# Patient Record
Sex: Female | Born: 1955 | Race: Black or African American | Hispanic: No | Marital: Married | Smoking: Never smoker
Health system: Southern US, Community
[De-identification: ages and names within clinical notes are randomized; demographics above are authoritative.]

## PROBLEM LIST (undated history)

## (undated) DIAGNOSIS — I10 Essential (primary) hypertension: Secondary | ICD-10-CM

## (undated) DIAGNOSIS — E119 Type 2 diabetes mellitus without complications: Secondary | ICD-10-CM

## (undated) HISTORY — PX: BARIATRIC SURGERY: SHX1103

---

## 1999-08-14 ENCOUNTER — Emergency Department (HOSPITAL_COMMUNITY): Admission: EM | Admit: 1999-08-14 | Discharge: 1999-08-14 | Payer: Self-pay | Admitting: Emergency Medicine

## 1999-09-08 ENCOUNTER — Encounter: Payer: Self-pay | Admitting: Obstetrics and Gynecology

## 1999-09-08 ENCOUNTER — Encounter: Admission: RE | Admit: 1999-09-08 | Discharge: 1999-09-08 | Payer: Self-pay | Admitting: Obstetrics and Gynecology

## 2001-06-14 ENCOUNTER — Encounter: Payer: Self-pay | Admitting: Emergency Medicine

## 2001-06-14 ENCOUNTER — Emergency Department (HOSPITAL_COMMUNITY): Admission: EM | Admit: 2001-06-14 | Discharge: 2001-06-14 | Payer: Self-pay | Admitting: Emergency Medicine

## 2014-11-01 HISTORY — PX: BREAST BIOPSY: SHX20

## 2015-04-07 ENCOUNTER — Other Ambulatory Visit: Payer: Self-pay

## 2015-04-07 DIAGNOSIS — Z1231 Encounter for screening mammogram for malignant neoplasm of breast: Secondary | ICD-10-CM

## 2015-04-14 ENCOUNTER — Ambulatory Visit: Payer: Self-pay

## 2015-05-15 ENCOUNTER — Ambulatory Visit
Admission: RE | Admit: 2015-05-15 | Discharge: 2015-05-15 | Disposition: A | Discharge status: Home / Self Care | Payer: 59 | Source: Ambulatory Visit

## 2015-05-15 DIAGNOSIS — Z1231 Encounter for screening mammogram for malignant neoplasm of breast: Secondary | ICD-10-CM

## 2015-05-20 ENCOUNTER — Other Ambulatory Visit: Payer: Self-pay | Admitting: Internal Medicine

## 2015-05-20 DIAGNOSIS — R928 Other abnormal and inconclusive findings on diagnostic imaging of breast: Secondary | ICD-10-CM

## 2015-07-04 ENCOUNTER — Other Ambulatory Visit: Payer: Self-pay | Admitting: Internal Medicine

## 2015-07-04 ENCOUNTER — Ambulatory Visit
Admission: RE | Admit: 2015-07-04 | Discharge: 2015-07-04 | Disposition: A | Discharge status: Home / Self Care | Payer: 59 | Source: Ambulatory Visit | Attending: Internal Medicine | Admitting: Internal Medicine

## 2015-07-04 DIAGNOSIS — R928 Other abnormal and inconclusive findings on diagnostic imaging of breast: Secondary | ICD-10-CM

## 2015-07-09 ENCOUNTER — Ambulatory Visit
Admission: RE | Admit: 2015-07-09 | Discharge: 2015-07-09 | Disposition: A | Discharge status: Home / Self Care | Payer: 59 | Source: Ambulatory Visit | Attending: Internal Medicine | Admitting: Internal Medicine

## 2015-07-09 ENCOUNTER — Other Ambulatory Visit: Payer: Self-pay | Admitting: Internal Medicine

## 2015-07-09 DIAGNOSIS — R928 Other abnormal and inconclusive findings on diagnostic imaging of breast: Secondary | ICD-10-CM

## 2016-02-12 ENCOUNTER — Other Ambulatory Visit: Payer: Self-pay

## 2016-02-12 ENCOUNTER — Other Ambulatory Visit: Payer: Self-pay | Admitting: Internal Medicine

## 2016-02-12 DIAGNOSIS — Z1231 Encounter for screening mammogram for malignant neoplasm of breast: Secondary | ICD-10-CM

## 2016-02-18 DIAGNOSIS — E669 Obesity, unspecified: Secondary | ICD-10-CM | POA: Diagnosis not present

## 2016-03-05 DIAGNOSIS — L509 Urticaria, unspecified: Secondary | ICD-10-CM | POA: Diagnosis not present

## 2016-03-05 DIAGNOSIS — R7301 Impaired fasting glucose: Secondary | ICD-10-CM | POA: Diagnosis not present

## 2016-03-05 DIAGNOSIS — I10 Essential (primary) hypertension: Secondary | ICD-10-CM | POA: Diagnosis not present

## 2016-04-05 DIAGNOSIS — I10 Essential (primary) hypertension: Secondary | ICD-10-CM | POA: Diagnosis not present

## 2016-04-05 DIAGNOSIS — R7301 Impaired fasting glucose: Secondary | ICD-10-CM | POA: Diagnosis not present

## 2016-04-05 DIAGNOSIS — L509 Urticaria, unspecified: Secondary | ICD-10-CM | POA: Diagnosis not present

## 2016-05-17 ENCOUNTER — Inpatient Hospital Stay: Admission: RE | Admit: 2016-05-17 | Payer: Self-pay | Source: Ambulatory Visit

## 2016-05-31 ENCOUNTER — Ambulatory Visit (HOSPITAL_COMMUNITY)
Admission: EM | Admit: 2016-05-31 | Discharge: 2016-05-31 | Disposition: A | Discharge status: Home / Self Care | Payer: BLUE CROSS/BLUE SHIELD | Attending: Family Medicine | Admitting: Family Medicine

## 2016-05-31 ENCOUNTER — Encounter (HOSPITAL_COMMUNITY): Payer: Self-pay | Admitting: Emergency Medicine

## 2016-05-31 DIAGNOSIS — S76219A Strain of adductor muscle, fascia and tendon of unspecified thigh, initial encounter: Secondary | ICD-10-CM

## 2016-05-31 DIAGNOSIS — S39011A Strain of muscle, fascia and tendon of abdomen, initial encounter: Secondary | ICD-10-CM

## 2016-05-31 DIAGNOSIS — S39012A Strain of muscle, fascia and tendon of lower back, initial encounter: Secondary | ICD-10-CM

## 2016-05-31 HISTORY — DX: Essential (primary) hypertension: I10

## 2016-05-31 HISTORY — DX: Type 2 diabetes mellitus without complications: E11.9

## 2016-05-31 MED ORDER — METHYLPREDNISOLONE 4 MG PO TBPK: ORAL_TABLET | ORAL | 0 refills | Status: DC

## 2016-05-31 MED ORDER — CYCLOBENZAPRINE HCL 10 MG PO TABS: 10.0000 mg | ORAL_TABLET | Freq: Two times a day (BID) | ORAL | 0 refills | Status: DC | PRN

## 2016-05-31 NOTE — ED Triage Notes (Signed)
Patient reports one week ago, bending over and using her knees as well to pick up a "pan of concrete".  Felt pain immediately .  Since then pain in lower back, left hip and low pelvis.  No urinary or bowel complaints

## 2016-05-31 NOTE — ED Provider Notes (Signed)
CSN: [id number]     Arrival date & time 05/31/16  0165 History   None    Chief Complaint  Patient presents with  . Back Pain   (Consider location/radiation/quality/duration/timing/severity/associated sxs/prior Treatment) Patient states 2 weeks ago she was mixing cement and pulled a muscle in her back and she is hurting on the left side and it radiates down her left butock.   The history is provided by the patient.    No past medical history on file. No past surgical history on file. No family history on file. Social History  Substance Use Topics  . Smoking status: Not on file  . Smokeless tobacco: Not on file  . Alcohol use Not on file   OB History    No data available     Review of Systems  Constitutional: Negative.   HENT: Negative.   Eyes: Negative.   Respiratory: Negative.   Cardiovascular: Negative.   Gastrointestinal: Negative.   Endocrine: Negative.   Genitourinary: Negative.   Musculoskeletal: Positive for back pain.  Skin: Negative.   Allergic/Immunologic: Negative.   Neurological: Negative.   Hematological: Negative.   Psychiatric/Behavioral: Negative.     Allergies  Review of patient's allergies indicates not on file.  Home Medications   Prior to Admission medications   Not on File   Meds Ordered and Administered this Visit  Medications - No data to display  There were no vitals taken for this visit. No data found.   Physical Exam  Constitutional: She appears well-developed and well-nourished.  HENT:  Head: Normocephalic and atraumatic.  Eyes: Pupils are equal, round, and reactive to light.  Cardiovascular: Normal rate, regular rhythm and normal heart sounds.   Pulmonary/Chest: Effort normal and breath sounds normal.  Musculoskeletal: She exhibits tenderness.  Left lumbar paraspinous muscle w/o tenderness and decreased Rom lumbar spine.    Urgent Care Course   Clinical Course    Procedures (including critical care time)  Labs  Review Labs Reviewed - No data to display  Imaging Review No results found.   Visual Acuity Review  Right Eye Distance:   Left Eye Distance:   Bilateral Distance:    Right Eye Near:   Left Eye Near:    Bilateral Near:         MDM  Left lumbar muscle strain - Medrol dose pack as directed, cyclobenzaprine  10mg  one po bid prn pain #20.  Apply heating pad to LS spine.      Deatra Canter, FNP 05/31/16 1102

## 2016-06-14 DIAGNOSIS — R7301 Impaired fasting glucose: Secondary | ICD-10-CM | POA: Diagnosis not present

## 2016-06-14 DIAGNOSIS — M545 Low back pain: Secondary | ICD-10-CM | POA: Diagnosis not present

## 2016-06-17 DIAGNOSIS — N62 Hypertrophy of breast: Secondary | ICD-10-CM | POA: Diagnosis not present

## 2016-06-17 DIAGNOSIS — Z9884 Bariatric surgery status: Secondary | ICD-10-CM | POA: Diagnosis not present

## 2016-06-23 ENCOUNTER — Ambulatory Visit: Payer: Self-pay

## 2016-07-01 ENCOUNTER — Ambulatory Visit: Payer: Self-pay

## 2016-07-15 ENCOUNTER — Ambulatory Visit
Admission: RE | Admit: 2016-07-15 | Discharge: 2016-07-15 | Disposition: A | Discharge status: Home / Self Care | Payer: BLUE CROSS/BLUE SHIELD | Source: Ambulatory Visit

## 2016-07-15 DIAGNOSIS — Z1231 Encounter for screening mammogram for malignant neoplasm of breast: Secondary | ICD-10-CM | POA: Diagnosis not present

## 2016-07-16 DIAGNOSIS — Z9884 Bariatric surgery status: Secondary | ICD-10-CM | POA: Diagnosis not present

## 2016-07-16 DIAGNOSIS — R1013 Epigastric pain: Secondary | ICD-10-CM | POA: Diagnosis not present

## 2016-07-16 DIAGNOSIS — M109 Gout, unspecified: Secondary | ICD-10-CM | POA: Diagnosis not present

## 2016-07-16 DIAGNOSIS — Z92241 Personal history of systemic steroid therapy: Secondary | ICD-10-CM | POA: Diagnosis not present

## 2016-07-16 DIAGNOSIS — K912 Postsurgical malabsorption, not elsewhere classified: Secondary | ICD-10-CM | POA: Diagnosis not present

## 2016-08-04 DIAGNOSIS — I10 Essential (primary) hypertension: Secondary | ICD-10-CM | POA: Diagnosis not present

## 2016-08-04 DIAGNOSIS — Z9884 Bariatric surgery status: Secondary | ICD-10-CM | POA: Diagnosis not present

## 2016-09-08 DIAGNOSIS — Z1389 Encounter for screening for other disorder: Secondary | ICD-10-CM | POA: Diagnosis not present

## 2016-09-08 DIAGNOSIS — Z01419 Encounter for gynecological examination (general) (routine) without abnormal findings: Secondary | ICD-10-CM | POA: Diagnosis not present

## 2016-09-08 DIAGNOSIS — Z130 Encounter for screening for diseases of the blood and blood-forming organs and certain disorders involving the immune mechanism: Secondary | ICD-10-CM | POA: Diagnosis not present

## 2016-09-08 DIAGNOSIS — R1900 Intra-abdominal and pelvic swelling, mass and lump, unspecified site: Secondary | ICD-10-CM | POA: Diagnosis not present

## 2016-09-14 DIAGNOSIS — R1904 Left lower quadrant abdominal swelling, mass and lump: Secondary | ICD-10-CM | POA: Diagnosis not present

## 2016-09-27 DIAGNOSIS — I10 Essential (primary) hypertension: Secondary | ICD-10-CM | POA: Diagnosis not present

## 2016-09-27 DIAGNOSIS — R7301 Impaired fasting glucose: Secondary | ICD-10-CM | POA: Diagnosis not present

## 2016-09-27 DIAGNOSIS — B355 Tinea imbricata: Secondary | ICD-10-CM | POA: Diagnosis not present

## 2016-09-27 DIAGNOSIS — L509 Urticaria, unspecified: Secondary | ICD-10-CM | POA: Diagnosis not present

## 2016-12-27 DIAGNOSIS — M26609 Unspecified temporomandibular joint disorder, unspecified side: Secondary | ICD-10-CM | POA: Diagnosis not present

## 2016-12-27 DIAGNOSIS — R7301 Impaired fasting glucose: Secondary | ICD-10-CM | POA: Diagnosis not present

## 2016-12-27 DIAGNOSIS — I10 Essential (primary) hypertension: Secondary | ICD-10-CM | POA: Diagnosis not present

## 2017-02-10 DIAGNOSIS — Z903 Acquired absence of stomach [part of]: Secondary | ICD-10-CM | POA: Diagnosis not present

## 2017-04-01 DIAGNOSIS — H1045 Other chronic allergic conjunctivitis: Secondary | ICD-10-CM | POA: Diagnosis not present

## 2017-04-11 DIAGNOSIS — M542 Cervicalgia: Secondary | ICD-10-CM | POA: Diagnosis not present

## 2017-04-11 DIAGNOSIS — L304 Erythema intertrigo: Secondary | ICD-10-CM | POA: Diagnosis not present

## 2017-04-11 DIAGNOSIS — N62 Hypertrophy of breast: Secondary | ICD-10-CM | POA: Diagnosis not present

## 2017-04-11 DIAGNOSIS — M549 Dorsalgia, unspecified: Secondary | ICD-10-CM | POA: Diagnosis not present

## 2017-05-16 DIAGNOSIS — R7301 Impaired fasting glucose: Secondary | ICD-10-CM | POA: Diagnosis not present

## 2017-05-16 DIAGNOSIS — N62 Hypertrophy of breast: Secondary | ICD-10-CM | POA: Diagnosis not present

## 2017-05-16 DIAGNOSIS — Z13220 Encounter for screening for lipoid disorders: Secondary | ICD-10-CM | POA: Diagnosis not present

## 2017-05-16 DIAGNOSIS — I10 Essential (primary) hypertension: Secondary | ICD-10-CM | POA: Diagnosis not present

## 2017-08-04 ENCOUNTER — Other Ambulatory Visit: Payer: Self-pay | Admitting: Internal Medicine

## 2017-08-04 DIAGNOSIS — Z1231 Encounter for screening mammogram for malignant neoplasm of breast: Secondary | ICD-10-CM

## 2017-08-08 DIAGNOSIS — Z903 Acquired absence of stomach [part of]: Secondary | ICD-10-CM | POA: Diagnosis not present

## 2017-08-08 DIAGNOSIS — K912 Postsurgical malabsorption, not elsewhere classified: Secondary | ICD-10-CM | POA: Diagnosis not present

## 2017-08-24 ENCOUNTER — Ambulatory Visit
Admission: RE | Admit: 2017-08-24 | Discharge: 2017-08-24 | Disposition: A | Discharge status: Home / Self Care | Payer: BLUE CROSS/BLUE SHIELD | Source: Ambulatory Visit | Attending: Internal Medicine | Admitting: Internal Medicine

## 2017-08-24 DIAGNOSIS — Z1231 Encounter for screening mammogram for malignant neoplasm of breast: Secondary | ICD-10-CM

## 2017-09-27 DIAGNOSIS — I10 Essential (primary) hypertension: Secondary | ICD-10-CM | POA: Diagnosis not present

## 2017-09-27 DIAGNOSIS — Z903 Acquired absence of stomach [part of]: Secondary | ICD-10-CM | POA: Diagnosis not present

## 2017-09-27 DIAGNOSIS — K912 Postsurgical malabsorption, not elsewhere classified: Secondary | ICD-10-CM | POA: Diagnosis not present

## 2017-10-05 DIAGNOSIS — Z130 Encounter for screening for diseases of the blood and blood-forming organs and certain disorders involving the immune mechanism: Secondary | ICD-10-CM | POA: Diagnosis not present

## 2017-10-05 DIAGNOSIS — Z1389 Encounter for screening for other disorder: Secondary | ICD-10-CM | POA: Diagnosis not present

## 2017-10-05 DIAGNOSIS — Z6824 Body mass index (BMI) 24.0-24.9, adult: Secondary | ICD-10-CM | POA: Diagnosis not present

## 2017-10-05 DIAGNOSIS — Z01419 Encounter for gynecological examination (general) (routine) without abnormal findings: Secondary | ICD-10-CM | POA: Diagnosis not present

## 2018-07-14 ENCOUNTER — Other Ambulatory Visit: Payer: Self-pay | Admitting: Internal Medicine

## 2018-07-14 DIAGNOSIS — Z1231 Encounter for screening mammogram for malignant neoplasm of breast: Secondary | ICD-10-CM

## 2018-08-22 ENCOUNTER — Emergency Department (HOSPITAL_COMMUNITY): Payer: BLUE CROSS/BLUE SHIELD

## 2018-08-22 ENCOUNTER — Other Ambulatory Visit: Payer: Self-pay

## 2018-08-22 ENCOUNTER — Encounter (HOSPITAL_COMMUNITY): Payer: Self-pay | Admitting: Emergency Medicine

## 2018-08-22 ENCOUNTER — Emergency Department (HOSPITAL_COMMUNITY)
Admission: EM | Admit: 2018-08-22 | Discharge: 2018-08-22 | Disposition: A | Discharge status: Home / Self Care | Payer: BLUE CROSS/BLUE SHIELD | Attending: Emergency Medicine | Admitting: Emergency Medicine

## 2018-08-22 DIAGNOSIS — Y998 Other external cause status: Secondary | ICD-10-CM | POA: Insufficient documentation

## 2018-08-22 DIAGNOSIS — Y92410 Unspecified street and highway as the place of occurrence of the external cause: Secondary | ICD-10-CM | POA: Insufficient documentation

## 2018-08-22 DIAGNOSIS — Y9389 Activity, other specified: Secondary | ICD-10-CM | POA: Insufficient documentation

## 2018-08-22 DIAGNOSIS — R079 Chest pain, unspecified: Secondary | ICD-10-CM | POA: Diagnosis not present

## 2018-08-22 DIAGNOSIS — I10 Essential (primary) hypertension: Secondary | ICD-10-CM | POA: Insufficient documentation

## 2018-08-22 DIAGNOSIS — R0789 Other chest pain: Secondary | ICD-10-CM | POA: Diagnosis not present

## 2018-08-22 DIAGNOSIS — E119 Type 2 diabetes mellitus without complications: Secondary | ICD-10-CM | POA: Insufficient documentation

## 2018-08-22 LAB — I-STAT CHEM 8, ED
BUN: 11 mg/dL (ref 8–23)
CALCIUM ION: 1.21 mmol/L (ref 1.15–1.40)
Chloride: 102 mmol/L (ref 98–111)
Creatinine, Ser: 0.8 mg/dL (ref 0.44–1.00)
GLUCOSE: 145 mg/dL — AB (ref 70–99)
HCT: 35 % — ABNORMAL LOW (ref 36.0–46.0)
Hemoglobin: 11.9 g/dL — ABNORMAL LOW (ref 12.0–15.0)
Potassium: 3.6 mmol/L (ref 3.5–5.1)
SODIUM: 137 mmol/L (ref 135–145)
TCO2: 29 mmol/L (ref 22–32)

## 2018-08-22 MED ORDER — IBUPROFEN 600 MG PO TABS: 600.0000 mg | ORAL_TABLET | Freq: Three times a day (TID) | ORAL | 0 refills | Status: DC | PRN

## 2018-08-22 MED ORDER — IBUPROFEN 400 MG PO TABS
600.0000 mg | ORAL_TABLET | Freq: Once | ORAL | Status: AC
  Administered 2018-08-22: 600 mg via ORAL
  Filled 2018-08-22: qty 1

## 2018-08-22 MED ORDER — ACETAMINOPHEN 500 MG PO TABS
1000.0000 mg | ORAL_TABLET | Freq: Once | ORAL | Status: AC
Start: 2018-08-22 — End: 2018-08-22
  Administered 2018-08-22: 1000 mg via ORAL
  Filled 2018-08-22: qty 2

## 2018-08-22 MED ORDER — CYCLOBENZAPRINE HCL 10 MG PO TABS
5.0000 mg | ORAL_TABLET | Freq: Once | ORAL | Status: AC
  Administered 2018-08-22: 5 mg via ORAL
  Filled 2018-08-22: qty 1

## 2018-08-22 MED ORDER — CYCLOBENZAPRINE HCL 10 MG PO TABS: 10.0000 mg | ORAL_TABLET | Freq: Three times a day (TID) | ORAL | 0 refills | Status: DC | PRN

## 2018-08-22 NOTE — ED Notes (Signed)
Patient verbalizes understanding of discharge instructions. Opportunity for questioning and answers were provided. Ambulatory at discharge in NAD.  

## 2018-08-22 NOTE — ED Notes (Signed)
ED Provider at bedside. 
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
## 2018-08-22 NOTE — ED Notes (Signed)
Got patient on the monitor patient is resting with call bell in reach and family at bedside °

## 2018-08-25 ENCOUNTER — Ambulatory Visit: Payer: BLUE CROSS/BLUE SHIELD

## 2018-08-25 DIAGNOSIS — R7303 Prediabetes: Secondary | ICD-10-CM | POA: Diagnosis not present

## 2018-08-25 DIAGNOSIS — Z0000 Encounter for general adult medical examination without abnormal findings: Secondary | ICD-10-CM | POA: Diagnosis not present

## 2018-08-25 DIAGNOSIS — M542 Cervicalgia: Secondary | ICD-10-CM | POA: Diagnosis not present

## 2018-08-25 DIAGNOSIS — M545 Low back pain: Secondary | ICD-10-CM | POA: Diagnosis not present

## 2018-08-25 DIAGNOSIS — Z13220 Encounter for screening for lipoid disorders: Secondary | ICD-10-CM | POA: Diagnosis not present

## 2018-08-25 DIAGNOSIS — L259 Unspecified contact dermatitis, unspecified cause: Secondary | ICD-10-CM | POA: Diagnosis not present

## 2018-08-25 DIAGNOSIS — R071 Chest pain on breathing: Secondary | ICD-10-CM | POA: Diagnosis not present

## 2018-08-25 DIAGNOSIS — I10 Essential (primary) hypertension: Secondary | ICD-10-CM | POA: Diagnosis not present

## 2018-10-06 ENCOUNTER — Ambulatory Visit
Admission: RE | Admit: 2018-10-06 | Discharge: 2018-10-06 | Disposition: A | Discharge status: Home / Self Care | Payer: BLUE CROSS/BLUE SHIELD | Source: Ambulatory Visit | Attending: Internal Medicine | Admitting: Internal Medicine

## 2018-10-06 DIAGNOSIS — Z1231 Encounter for screening mammogram for malignant neoplasm of breast: Secondary | ICD-10-CM | POA: Diagnosis not present

## 2018-10-09 DIAGNOSIS — N762 Acute vulvitis: Secondary | ICD-10-CM | POA: Diagnosis not present

## 2018-10-09 DIAGNOSIS — Z1389 Encounter for screening for other disorder: Secondary | ICD-10-CM | POA: Diagnosis not present

## 2018-10-09 DIAGNOSIS — Z130 Encounter for screening for diseases of the blood and blood-forming organs and certain disorders involving the immune mechanism: Secondary | ICD-10-CM | POA: Diagnosis not present

## 2018-10-09 DIAGNOSIS — Z01419 Encounter for gynecological examination (general) (routine) without abnormal findings: Secondary | ICD-10-CM | POA: Diagnosis not present

## 2018-12-10 ENCOUNTER — Encounter (HOSPITAL_COMMUNITY): Payer: Self-pay

## 2018-12-10 ENCOUNTER — Other Ambulatory Visit: Payer: Self-pay

## 2018-12-10 ENCOUNTER — Inpatient Hospital Stay (HOSPITAL_COMMUNITY)
Admission: EM | Admit: 2018-12-10 | Discharge: 2018-12-13 | DRG: 392 | Disposition: A | Discharge status: Home / Self Care | Payer: BLUE CROSS/BLUE SHIELD | Attending: Surgery | Admitting: Surgery

## 2018-12-10 ENCOUNTER — Emergency Department (HOSPITAL_COMMUNITY): Payer: BLUE CROSS/BLUE SHIELD

## 2018-12-10 DIAGNOSIS — R1111 Vomiting without nausea: Secondary | ICD-10-CM | POA: Diagnosis not present

## 2018-12-10 DIAGNOSIS — Z8679 Personal history of other diseases of the circulatory system: Secondary | ICD-10-CM

## 2018-12-10 DIAGNOSIS — K5909 Other constipation: Secondary | ICD-10-CM | POA: Diagnosis not present

## 2018-12-10 DIAGNOSIS — Z9884 Bariatric surgery status: Secondary | ICD-10-CM | POA: Diagnosis not present

## 2018-12-10 DIAGNOSIS — Z8639 Personal history of other endocrine, nutritional and metabolic disease: Secondary | ICD-10-CM | POA: Diagnosis not present

## 2018-12-10 DIAGNOSIS — K56609 Unspecified intestinal obstruction, unspecified as to partial versus complete obstruction: Secondary | ICD-10-CM | POA: Diagnosis not present

## 2018-12-10 DIAGNOSIS — R1030 Lower abdominal pain, unspecified: Secondary | ICD-10-CM | POA: Diagnosis not present

## 2018-12-10 DIAGNOSIS — R1084 Generalized abdominal pain: Secondary | ICD-10-CM | POA: Diagnosis not present

## 2018-12-10 DIAGNOSIS — R112 Nausea with vomiting, unspecified: Secondary | ICD-10-CM | POA: Diagnosis not present

## 2018-12-10 DIAGNOSIS — K56600 Partial intestinal obstruction, unspecified as to cause: Secondary | ICD-10-CM | POA: Diagnosis not present

## 2018-12-10 LAB — URINALYSIS, ROUTINE W REFLEX MICROSCOPIC
Bilirubin Urine: NEGATIVE
GLUCOSE, UA: NEGATIVE mg/dL
Hgb urine dipstick: NEGATIVE
KETONES UR: 20 mg/dL — AB
Leukocytes, UA: NEGATIVE
NITRITE: NEGATIVE
PH: 8 (ref 5.0–8.0)
Protein, ur: NEGATIVE mg/dL
Specific Gravity, Urine: 1.04 — ABNORMAL HIGH (ref 1.005–1.030)

## 2018-12-10 LAB — COMPREHENSIVE METABOLIC PANEL
ALT: 14 U/L (ref 0–44)
AST: 22 U/L (ref 15–41)
Albumin: 4.3 g/dL (ref 3.5–5.0)
Alkaline Phosphatase: 53 U/L (ref 38–126)
Anion gap: 8 (ref 5–15)
BUN: 16 mg/dL (ref 8–23)
CHLORIDE: 105 mmol/L (ref 98–111)
CO2: 27 mmol/L (ref 22–32)
CREATININE: 0.68 mg/dL (ref 0.44–1.00)
Calcium: 9.4 mg/dL (ref 8.9–10.3)
GFR calc Af Amer: >60 mL/min (ref >60)
Glucose, Bld: 128 mg/dL — ABNORMAL HIGH (ref 70–99)
Potassium: 3.7 mmol/L (ref 3.5–5.1)
Sodium: 140 mmol/L (ref 135–145)
Total Bilirubin: 0.8 mg/dL (ref 0.3–1.2)
Total Protein: 7.1 g/dL (ref 6.5–8.1)

## 2018-12-10 LAB — CBC
HEMATOCRIT: 37 % (ref 36.0–46.0)
Hemoglobin: 12.7 g/dL (ref 12.0–15.0)
MCH: 27.9 pg (ref 26.0–34.0)
MCHC: 34.3 g/dL (ref 30.0–36.0)
MCV: 81.1 fL (ref 80.0–100.0)
Platelets: 320 10*3/uL (ref 150–400)
RBC: 4.56 MIL/uL (ref 3.87–5.11)
RDW: 12.4 % (ref 11.5–15.5)
WBC: 9.5 10*3/uL (ref 4.0–10.5)
nRBC: 0 % (ref 0.0–0.2)

## 2018-12-10 LAB — LIPASE, BLOOD: LIPASE: 25 U/L (ref 11–51)

## 2018-12-10 MED ORDER — ONDANSETRON HCL 4 MG/2ML IJ SOLN
4.0000 mg | Freq: Once | INTRAMUSCULAR | Status: AC
  Administered 2018-12-10: 4 mg via INTRAVENOUS
  Filled 2018-12-10: qty 2

## 2018-12-10 MED ORDER — MORPHINE SULFATE (PF) 4 MG/ML IV SOLN
4.0000 mg | Freq: Once | INTRAVENOUS | Status: AC
  Administered 2018-12-10: 4 mg via INTRAVENOUS
  Filled 2018-12-10: qty 1

## 2018-12-10 MED ORDER — MORPHINE SULFATE (PF) 2 MG/ML IV SOLN
1.0000 mg | INTRAVENOUS | Status: DC | PRN
  Administered 2018-12-10 (×2): 4 mg via INTRAVENOUS
  Administered 2018-12-11 (×2): 2 mg via INTRAVENOUS
  Administered 2018-12-11: 4 mg via INTRAVENOUS
  Administered 2018-12-12 (×4): 2 mg via INTRAVENOUS
  Filled 2018-12-10 (×2): qty 1
  Filled 2018-12-10: qty 2
  Filled 2018-12-10: qty 1
  Filled 2018-12-10 (×2): qty 2
  Filled 2018-12-10 (×3): qty 1

## 2018-12-10 MED ORDER — IOPAMIDOL (ISOVUE-300) INJECTION 61%
100.0000 mL | Freq: Once | INTRAVENOUS | Status: AC | PRN
  Administered 2018-12-10: 100 mL via INTRAVENOUS

## 2018-12-10 MED ORDER — LACTATED RINGERS IV BOLUS
1000.0000 mL | Freq: Once | INTRAVENOUS | Status: AC
  Administered 2018-12-10: 1000 mL via INTRAVENOUS

## 2018-12-10 MED ORDER — SODIUM CHLORIDE 0.9% FLUSH
3.0000 mL | Freq: Once | INTRAVENOUS | Status: AC
  Administered 2018-12-10: 3 mL via INTRAVENOUS

## 2018-12-10 MED ORDER — ONDANSETRON HCL 4 MG/2ML IJ SOLN
4.0000 mg | Freq: Four times a day (QID) | INTRAMUSCULAR | Status: DC | PRN
  Administered 2018-12-12 (×2): 4 mg via INTRAVENOUS
  Filled 2018-12-10 (×2): qty 2

## 2018-12-10 MED ORDER — SODIUM CHLORIDE (PF) 0.9 % IJ SOLN
INTRAMUSCULAR | Status: AC
  Filled 2018-12-10: qty 50

## 2018-12-10 MED ORDER — ACETAMINOPHEN 650 MG RE SUPP: 650.0000 mg | Freq: Four times a day (QID) | RECTAL | Status: DC | PRN

## 2018-12-10 MED ORDER — ONDANSETRON 4 MG PO TBDP: 4.0000 mg | ORAL_TABLET | Freq: Four times a day (QID) | ORAL | Status: DC | PRN

## 2018-12-10 MED ORDER — IOPAMIDOL (ISOVUE-300) INJECTION 61%
INTRAVENOUS | Status: AC
  Filled 2018-12-10: qty 100

## 2018-12-10 MED ORDER — DIPHENHYDRAMINE HCL 50 MG/ML IJ SOLN
25.0000 mg | Freq: Four times a day (QID) | INTRAMUSCULAR | Status: DC | PRN
  Administered 2018-12-10 – 2018-12-12 (×5): 25 mg via INTRAVENOUS
  Filled 2018-12-10 (×5): qty 1

## 2018-12-10 MED ORDER — ACETAMINOPHEN 325 MG PO TABS: 650.0000 mg | ORAL_TABLET | Freq: Four times a day (QID) | ORAL | Status: DC | PRN

## 2018-12-10 MED ORDER — ENOXAPARIN SODIUM 40 MG/0.4ML ~~LOC~~ SOLN
40.0000 mg | SUBCUTANEOUS | Status: DC
  Administered 2018-12-10: 40 mg via SUBCUTANEOUS
  Filled 2018-12-10: qty 0.4

## 2018-12-10 MED ORDER — KCL IN DEXTROSE-NACL 20-5-0.45 MEQ/L-%-% IV SOLN
INTRAVENOUS | Status: DC
  Administered 2018-12-10 – 2018-12-13 (×5): via INTRAVENOUS
  Filled 2018-12-10 (×7): qty 1000

## 2018-12-10 NOTE — ED Notes (Signed)
Bed: TU88 Expected date:  Expected time:  Means of arrival:  Comments: 64 yo abd pain

## 2018-12-10 NOTE — ED Notes (Signed)
Attempted to call report on 5W at this time. No answer. Will try again.

## 2018-12-10 NOTE — ED Provider Notes (Signed)
[facility]-EMERGENCY DEPT Provider Note   CSN: [id number] Arrival date & time: 12/10/18  1334     History   Chief Complaint Chief Complaint  Patient presents with  . Abdominal Pain  . Diarrhea  . Nausea    HPI Terance HartGloria Yvonne Sparrow is a 63 y.o. female.  The history is provided by the patient and medical records. No language interpreter was used.  Abdominal Pain  Associated symptoms: diarrhea   Diarrhea  Associated symptoms: abdominal pain      63 year old female with prior history of bariatric surgery, history of diabetes, hypertension brought here via EMS from home for evaluation of abdominal pain.  Patient developed lower abdominal pain which started yesterday afternoon after she ate dinner.  She described pain as a crampy sharp achy sensation, 10 out of 10, persistent with associated nausea and vomiting.  States she is vomiting up bilious content.  She has a normal bowel movement.  She endorses chills.  She has never had this kind of pain before.  She does not complain of any chest pain shortness of breath productive cough or dysuria.  Prior history of gastric sleeve surgery.  No specific treatment tried at home.  Past Medical History:  Diagnosis Date  . Diabetes mellitus without complication (HCC)   . Hypertension     There are no active problems to display for this patient.   Past Surgical History:  Procedure Laterality Date  . BARIATRIC SURGERY    . BREAST BIOPSY Right 2016   benign     OB History   No obstetric history on file.      Home Medications    Prior to Admission medications   Medication Sig Start Date End Date Taking? Authorizing Provider  cyclobenzaprine (FLEXERIL) 10 MG tablet Take 1 tablet (10 mg total) by mouth 2 (two) times daily as needed for muscle spasms. 05/31/16   Deatra Canterxford, William J, FNP  cyclobenzaprine (FLEXERIL) 10 MG tablet Take 1 tablet (10 mg total) by mouth 3 (three) times daily as needed for muscle spasms.  08/22/18   Azalia Bilisampos, Kevin, MD  ibuprofen (ADVIL,MOTRIN) 600 MG tablet Take 1 tablet (600 mg total) by mouth every 8 (eight) hours as needed. 08/22/18   Azalia Bilisampos, Kevin, MD  methylPREDNISolone (MEDROL DOSEPAK) 4 MG TBPK tablet Take 6-5-4-3-2-1 po qd 05/31/16   Deatra Canterxford, William J, FNP    Family History History reviewed. No pertinent family history.  Social History Social History   Tobacco Use  . Smoking status: Never Smoker  . Smokeless tobacco: Never Used  Substance Use Topics  . Alcohol use: No  . Drug use: No     Allergies   Patient has no known allergies.   Review of Systems Review of Systems  Gastrointestinal: Positive for abdominal pain and diarrhea.  All other systems reviewed and are negative.    Physical Exam Updated Vital Signs BP (!) 187/84 (BP Location: Right Arm)   Pulse (!) 57   Temp 97.8 F (36.6 C) (Oral)   Resp 18   SpO2 99%   Physical Exam Vitals signs and nursing note reviewed.  Constitutional:      General: She is in acute distress (Patient appears uncomfortable, actively vomiting.).     Appearance: She is well-developed.  HENT:     Head: Atraumatic.  Eyes:     Conjunctiva/sclera: Conjunctivae normal.  Neck:     Musculoskeletal: Neck supple.  Cardiovascular:     Rate and Rhythm: Normal rate and regular rhythm.  Heart sounds: No murmur. No friction rub. No gallop.   Pulmonary:     Effort: Pulmonary effort is normal.     Breath sounds: Normal breath sounds. No wheezing, rhonchi or rales.  Abdominal:     General: A surgical scar is present. Bowel sounds are decreased.     Palpations: Abdomen is soft.     Tenderness: There is abdominal tenderness in the right lower quadrant, periumbilical area and left lower quadrant. There is no right CVA tenderness or left CVA tenderness.     Hernia: No hernia is present.  Skin:    Findings: No rash.  Neurological:     Mental Status: She is alert and oriented to person, place, and time.  Psychiatric:          Mood and Affect: Mood normal.      ED Treatments / Results  Labs (all labs ordered are listed, but only abnormal results are displayed) Labs Reviewed  COMPREHENSIVE METABOLIC PANEL - Abnormal; Notable for the following components:      Result Value   Glucose, Bld 128 (*)    All other components within normal limits  LIPASE, BLOOD  CBC  URINALYSIS, ROUTINE W REFLEX MICROSCOPIC    EKG None  Radiology Ct Abdomen Pelvis W Contrast  Result Date: 12/10/2018 CLINICAL DATA:  Lower abdominal pain since last night, sharp nonradiating pain below umbilicus initially which has started to moved to the RIGHT side, history of diarrhea and emesis as well. Past history diabetes mellitus, hypertension EXAM: CT ABDOMEN AND PELVIS WITH CONTRAST TECHNIQUE: Multidetector CT imaging of the abdomen and pelvis was performed using the standard protocol following bolus administration of intravenous contrast. Sagittal and coronal MPR images reconstructed from axial data set. CONTRAST:  100mL ISOVUE-300 IOPAMIDOL (ISOVUE-300) INJECTION 61% IV. No oral contrast. COMPARISON:  None FINDINGS: Lower chest: Lung bases clear Hepatobiliary: Distended gallbladder. Small hepatic cyst medial RIGHT lobe superiorly 8 x 7 mm. Liver otherwise unremarkable. Pancreas: Normal appearance Spleen: Normal appearance Adrenals/Urinary Tract: Small LEFT renal cysts. Adrenal glands, kidneys, ureters, and bladder otherwise unremarkable. Stomach/Bowel: Appendix not visualized but no pericecal inflammatory process seen. Prior gastric bypass surgery with gastrojejunostomy. Few dilated proximal small bowel loops with stool sign and decompressed mid to distal small bowel loops consistent with proximal small bowel obstruction. Transition appears to be in the mid abdomen just LEFT of midline favor adhesion. Colon decompressed. Vascular/Lymphatic: Uterus atrophic with nonvisualization of ovaries Reproductive: No free air or free fluid. Other: No free  air or free fluid.  No hernia. Musculoskeletal: Unremarkable IMPRESSION: Proximal small bowel obstruction with transition zone in the anterior mid abdomen favor adhesion. Prior gastric bypass surgery. Electronically Signed   By: Ulyses SouthwardMark  Boles M.D.   On: 12/10/2018 15:59    Procedures Procedures (including critical care time)  Medications Ordered in ED Medications  iopamidol (ISOVUE-300) 61 % injection (has no administration in time range)  sodium chloride (PF) 0.9 % injection (has no administration in time range)  sodium chloride flush (NS) 0.9 % injection 3 mL (3 mLs Intravenous Given 12/10/18 1413)  lactated ringers bolus 1,000 mL (1,000 mLs Intravenous New Bag/Given 12/10/18 1434)  morphine 4 MG/ML injection 4 mg (4 mg Intravenous Given 12/10/18 1431)  ondansetron (ZOFRAN) injection 4 mg (4 mg Intravenous Given 12/10/18 1430)  iopamidol (ISOVUE-300) 61 % injection 100 mL (100 mLs Intravenous Contrast Given 12/10/18 1514)     Initial Impression / Assessment and Plan / ED Course  I have reviewed the triage vital  signs and the nursing notes.  Pertinent labs & imaging results that were available during my care of the patient were reviewed by me and considered in my medical decision making (see chart for details).     BP (!) 175/82   Pulse 68   Temp 97.8 F (36.6 C) (Oral)   Resp 18   SpO2 100%    Final Clinical Impressions(s) / ED Diagnoses   Final diagnoses:  SBO (small bowel obstruction) [facility])    ED Discharge Orders    None     2:27 PM Patient here with lower abdominal pain with associated nausea and vomiting.  Negative Murphy's.  Symptom concerning for SBO, appendicitis or diverticulitis.  Will obtain abdominal pelvic CT scan for further evaluation.  4:08 PM Abdominal pelvis CT scan demonstrate proximal small bowel obstruction with transition zone in the anterior mid abdomen favor adhesion.  Patient has prior gastric bypass surgery.  This was done 3 years ago in [place].   Will consult general surgery for further management.  Will place NG tube.  At this time patient felt much better after receiving pain medication and antinausea medication.  4:20 PM Appreciate consultation from general surgeon DR. Gerkins who agrees to see and admit pt for further management of her SBO.     Fayrene Helper, PA-C 12/10/18 1621    Samuel Jester, DO 12/11/18 [phone]

## 2018-12-10 NOTE — ED Triage Notes (Addendum)
Patient arrived via [facility] from home. Lower Acute abominal pain that started last night. Sharp pain. Non radiating. Below umbilical. Starting to move to right side now.  Pt states she had diarrhea yesterday and emesis.   Patient vomited once here in ED. Patient yelling out in pain. 10/10  Hx. of gastric sleeve. No other hx.    A/Ox4 Ambulatory

## 2018-12-10 NOTE — H&P (Signed)
Paula Hampton is an 63 y.o. female.    General Surgery [facility]  Chief Complaint: abdominal pain, small bowel obstruction  HPI: Patient is a 63 year old female who presents to the emergency department with 24-hour history of mid abdominal pain.  This was associated with nausea and emesis.  Patient had multiple episodes of vomiting.  She also had 1-2 episodes of diarrhea yesterday.  Patient was evaluated in the emergency department.  CT scan of abdomen and pelvis indicated a proximal small bowel obstruction likely secondary to adhesions.  Patient has a prior history of abdominal surgery including a gastric sleeve resection for weight loss performed 3 years ago in [place] by Dr. Adolphus Birchwood.  Patient also has a history of laparotomy for bowel obstruction and repair of incisional hernia.  Patient has achieved approximately 100 pound weight loss.  She is no longer treated for diabetes.  She is no longer treated for hypertension.  Over the past several hours the patient has noted marked improvement in her abdominal pain.  General surgery is called for evaluation and management.  Family is at the bedside.  Past Medical History:  Diagnosis Date  . Diabetes mellitus without complication (HCC)   . Hypertension     Past Surgical History:  Procedure Laterality Date  . BARIATRIC SURGERY    . BREAST BIOPSY Right 2016   benign    History reviewed. No pertinent family history. Social History:  reports that she has never smoked. She has never used smokeless tobacco. She reports that she does not drink alcohol or use drugs.  Allergies: No Known Allergies  (Not in a hospital admission)   Results for orders placed or performed during the hospital encounter of 12/10/18 (from the past 48 hour(s))  Urinalysis, Routine w reflex microscopic     Status: Abnormal   Collection Time: 12/10/18  2:02 PM  Result Value Ref Range   Color, Urine YELLOW YELLOW   APPearance CLEAR  CLEAR   Specific Gravity, Urine 1.040 (H) 1.005 - 1.030   pH 8.0 5.0 - 8.0   Glucose, UA NEGATIVE NEGATIVE mg/dL   Hgb urine dipstick NEGATIVE NEGATIVE   Bilirubin Urine NEGATIVE NEGATIVE   Ketones, ur 20 (A) NEGATIVE mg/dL   Protein, ur NEGATIVE NEGATIVE mg/dL   Nitrite NEGATIVE NEGATIVE   Leukocytes, UA NEGATIVE NEGATIVE    Comment: Performed at [facility], [address] [place], [place] [address]  Lipase, blood     Status: None   Collection Time: 12/10/18  2:12 PM  Result Value Ref Range   Lipase 25 11 - 51 U/L    Comment: Performed at [facility], [address], [place], [place] [address]  Comprehensive metabolic panel     Status: Abnormal   Collection Time: 12/10/18  2:12 PM  Result Value Ref Range   Sodium 140 135 - 145 mmol/L   Potassium 3.7 3.5 - 5.1 mmol/L   Chloride 105 98 - 111 mmol/L   CO2 27 22 - 32 mmol/L   Glucose, Bld 128 (H) 70 - 99 mg/dL   BUN 16 8 - 23 mg/dL   Creatinine, Ser 6.31 0.44 - 1.00 mg/dL   Calcium 9.4 8.9 - 49.7 mg/dL   Total Protein 7.1 6.5 - 8.1 g/dL   Albumin 4.3 3.5 - 5.0 g/dL   AST 22 15 - 41 U/L   ALT 14 0 - 44 U/L   Alkaline Phosphatase 53 38 - 126 U/L  Total Bilirubin 0.8 0.3 - 1.2 mg/dL   GFR calc non Af Amer >60 >60 mL/min   GFR calc Af Amer >60 >60 mL/min   Anion gap 8 5 - 15    Comment: Performed at [facility], [address], [place], [place] [address]  CBC     Status: None   Collection Time: 12/10/18  2:12 PM  Result Value Ref Range   WBC 9.5 4.0 - 10.5 K/uL   RBC 4.56 3.87 - 5.11 MIL/uL   Hemoglobin 12.7 12.0 - 15.0 g/dL   HCT 17.9 15.0 - 56.9 %   MCV 81.1 80.0 - 100.0 fL   MCH 27.9 26.0 - 34.0 pg   MCHC 34.3 30.0 - 36.0 g/dL   RDW 79.4 80.1 - 65.5 %   Platelets 320 150 - 400 K/uL   nRBC 0.0 0.0 - 0.2 %    Comment: Performed at [facility], [address], [place], [place] [address]   Ct Abdomen Pelvis W Contrast  Result Date:  12/10/2018 CLINICAL DATA:  Lower abdominal pain since last night, sharp nonradiating pain below umbilicus initially which has started to moved to the RIGHT side, history of diarrhea and emesis as well. Past history diabetes mellitus, hypertension EXAM: CT ABDOMEN AND PELVIS WITH CONTRAST TECHNIQUE: Multidetector CT imaging of the abdomen and pelvis was performed using the standard protocol following bolus administration of intravenous contrast. Sagittal and coronal MPR images reconstructed from axial data set. CONTRAST:  ISOVUE-300 IOPAMIDOL (ISOVUE-300) INJECTION 61% IV. No oral contrast. COMPARISON:  None FINDINGS: Lower chest: Lung bases clear Hepatobiliary: Distended gallbladder. Small hepatic cyst medial RIGHT lobe superiorly 8 x 7 mm. Liver otherwise unremarkable. Pancreas: Normal appearance Spleen: Normal appearance Adrenals/Urinary Tract: Small LEFT renal cysts. Adrenal glands, kidneys, ureters, and bladder otherwise unremarkable. Stomach/Bowel: Appendix not visualized but no pericecal inflammatory process seen. Prior gastric bypass surgery with gastrojejunostomy. Few dilated proximal small bowel loops with stool sign and decompressed mid to distal small bowel loops consistent with proximal small bowel obstruction. Transition appears to be in the mid abdomen just LEFT of midline favor adhesion. Colon decompressed. Vascular/Lymphatic: Uterus atrophic with nonvisualization of ovaries Reproductive: No free air or free fluid. Other: No free air or free fluid.  No hernia. Musculoskeletal: Unremarkable IMPRESSION: Proximal small bowel obstruction with transition zone in the anterior mid abdomen favor adhesion. Prior gastric bypass surgery. Electronically Signed   By: Ulyses Southward M.D.   On: 12/10/2018 15:59    Review of Systems  Constitutional: Negative for chills, diaphoresis and fever.  HENT: Negative.   Eyes: Negative.   Respiratory: Negative.   Cardiovascular: Negative.   Gastrointestinal:  Positive for abdominal pain, diarrhea, nausea and vomiting.  Genitourinary: Negative.   Musculoskeletal: Negative.   Skin: Negative.   Neurological: Negative.   Endo/Heme/Allergies: Negative.  Environmental allergies:    Psychiatric/Behavioral: Negative.   All other systems reviewed and are negative.   Blood pressure 116/74, pulse 66, temperature 97.8 F (36.6 C), temperature source Oral, resp. rate 18, SpO2 100 %. Physical Exam  Constitutional: She is oriented to person, place, and time. She appears well-developed and well-nourished. No distress.  HENT:  Head: Normocephalic and atraumatic.  Right Ear: External ear normal.  Left Ear: External ear normal.  Mouth/Throat: No oropharyngeal exudate.  Eyes: Pupils are equal, round, and reactive to light. Conjunctivae are normal. No scleral icterus.  Neck: Normal range of motion. Neck supple. No tracheal deviation present. No thyromegaly present.  Cardiovascular: Normal rate, regular rhythm and normal heart sounds.  No murmur heard. Respiratory: Effort normal and breath sounds normal. No respiratory distress. She has no wheezes.  GI: Soft. She exhibits no distension and no mass. There is no abdominal tenderness. There is no rebound and no guarding.  quiet  Musculoskeletal: Normal range of motion.        General: No deformity or edema.  Lymphadenopathy:    She has no cervical adenopathy.  Neurological: She is alert and oriented to person, place, and time.  Skin: Skin is warm and dry. She is not diaphoretic.  Psychiatric: She has a normal mood and affect. Her behavior is normal.     Assessment/Plan Small bowel obstruction  Hx of gastric sleeve resection, 2017, Dr. Adolphus Birchwoodasher  Hx of ex lap with LOA  Hx of incisional hernia repair  Admit to general surgery service.  Will ask one of our bariatric surgeons to review CT scan, although risk of internal hernia much less with gastric sleeve procedure.  NPO.  Avoid NG for now.  IV hydration.   Plan to repeat AXR in AM 2/10.  Rx nausea, pain prn.  Plan is discussed with the patient and her family.  They are in agreement.  Darnell Levelodd Jaid Quirion, MD [facility] Office: [phone]    Darnell Levelodd Amilya Haver, MD 12/10/2018, 4:51 PM

## 2018-12-10 NOTE — ED Notes (Signed)
Attempted to insert NG tube X2. Each time patient jerks my hand and tube away. Patient states, "I'd rather die before putting that back into my nose". Patient understands the risks of not having the NG tube. Patient states, "I might try it again later". Pa, Bowie made aware.

## 2018-12-11 ENCOUNTER — Observation Stay (HOSPITAL_COMMUNITY): Payer: BLUE CROSS/BLUE SHIELD

## 2018-12-11 DIAGNOSIS — K56600 Partial intestinal obstruction, unspecified as to cause: Secondary | ICD-10-CM | POA: Diagnosis not present

## 2018-12-11 DIAGNOSIS — K56609 Unspecified intestinal obstruction, unspecified as to partial versus complete obstruction: Secondary | ICD-10-CM | POA: Diagnosis not present

## 2018-12-11 LAB — HIV ANTIBODY (ROUTINE TESTING W REFLEX): HIV Screen 4th Generation wRfx: NONREACTIVE

## 2018-12-11 NOTE — Progress Notes (Signed)
    CC: Abdominal pain, nausea, diarrhea Subjective: Less pain than on admission yesterday.  She does have few bowel sounds.  Flatus or BM reported.  She has issues with chronic constipation.  Objective: Vital signs in last 24 hours: Temp:  [97.8 F (36.6 C)-98.9 F (37.2 C)] 98.3 F (36.8 C) (02/10 0520) Pulse Rate:  [55-69] 55 (02/10 0520) Resp:  [12-18] 14 (02/10 0520) BP: (102-187)/(60-91) 108/63 (02/10 0520) SpO2:  [99 %-100 %] 100 % (02/10 0520) Weight:  [64 kg] 64 kg (02/10 0437) Last BM Date: 12/09/18 60 p.o. 2100 IV Voided x1 No BM recorded No labs this a.m. Admission CT scan: Proximal small bowel obstruction with transition in the mid right abdomen with prior gastric bypass surgery. Afebrile vital signs are stable  A.m. film: Dilated loops in the left upper quadrant are stable to mildly improved  Intake/Output from previous day: 02/09 0701 - 02/10 0700 In: 2221.5 [P.O.:60; I.V.:1161.5; IV Piggyback:1000] Out: 0  Intake/Output this shift: No intake/output data recorded.  General appearance: alert, cooperative and no distress Resp: clear to auscultation bilaterally GI: Soft, minimal discomfort and distention.  Positive bowel sounds no BM or flatus.  Lab Results:  Recent Labs    12/10/18 1412  WBC 9.5  HGB 12.7  HCT 37.0  PLT 320    BMET Recent Labs    12/10/18 1412  NA 140  K 3.7  CL 105  CO2 27  GLUCOSE 128*  BUN 16  CREATININE 0.68  CALCIUM 9.4   PT/INR No results for input(s): LABPROT, INR in the last 72 hours.  Recent Labs  Lab 12/10/18 1412  AST 22  ALT 14  ALKPHOS 53  BILITOT 0.8  PROT 7.1  ALBUMIN 4.3     Lipase     Component Value Date/Time   LIPASE 25 12/10/2018 1412   Prior to Admission medications   Not on File      Medications: . enoxaparin (LOVENOX) injection  40 mg Subcutaneous Q24H   . dextrose 5 % and 0.45 % NaCl with KCl 20 mEq/L 100 mL/hr at 12/11/18 3846   Anti-infectives (From admission, onward)    None      Assessment/Plan Chronic constipation -patient averages 1 bowel movement per week last BM was Friday, 12/08/2018   Small bowel obstruction Hx gastric sleeve resection 2017, Hx exploratory laparotomy with lysis of adhesions, Hx incisional hernia repair  -Less distended and tender.  No flatus/BM recorded.  FEN: The fluids/n.p.o. ID: None DVT: Lovenox Follow-up: To be determined   Plan: Mobilize, soapsuds enema.  Continue ice chips and sips only.  Recheck film in a.m.  LOS: 0 days    Paula Hampton 12/11/2018 [phone]

## 2018-12-12 ENCOUNTER — Inpatient Hospital Stay (HOSPITAL_COMMUNITY): Payer: BLUE CROSS/BLUE SHIELD

## 2018-12-12 DIAGNOSIS — Z8639 Personal history of other endocrine, nutritional and metabolic disease: Secondary | ICD-10-CM | POA: Diagnosis not present

## 2018-12-12 DIAGNOSIS — Z8679 Personal history of other diseases of the circulatory system: Secondary | ICD-10-CM | POA: Diagnosis not present

## 2018-12-12 DIAGNOSIS — K5909 Other constipation: Secondary | ICD-10-CM | POA: Diagnosis present

## 2018-12-12 DIAGNOSIS — Z9884 Bariatric surgery status: Secondary | ICD-10-CM | POA: Diagnosis not present

## 2018-12-12 DIAGNOSIS — K56600 Partial intestinal obstruction, unspecified as to cause: Secondary | ICD-10-CM | POA: Diagnosis not present

## 2018-12-12 DIAGNOSIS — K56609 Unspecified intestinal obstruction, unspecified as to partial versus complete obstruction: Secondary | ICD-10-CM | POA: Diagnosis not present

## 2018-12-12 MED ORDER — DIATRIZOATE MEGLUMINE & SODIUM 66-10 % PO SOLN
90.0000 mL | Freq: Once | ORAL | Status: AC
  Administered 2018-12-12: 90 mL via ORAL
  Filled 2018-12-12: qty 90

## 2018-12-12 NOTE — Progress Notes (Signed)
Radiology Tech at bedside for portable xray.

## 2018-12-12 NOTE — Progress Notes (Signed)
    CC: Abdominal pain Subjective: Still complaining of abdominal pain this morning.,  Some nausea.  She had minimal results with the enema yesterday.  She still has some abdominal distention.  No vomiting.  Is up ambulating in the halls. Objective: Vital signs in last 24 hours: Temp:  [98 F (36.7 C)-98.4 F (36.9 C)] 98.4 F (36.9 C) (02/11 0505) Pulse Rate:  [57-62] 57 (02/11 0505) Resp:  [16-20] 16 (02/11 0505) BP: (120-129)/(62-65) 129/64 (02/11 0505) SpO2:  [99 %-100 %] 99 % (02/11 0505) Last BM Date: (02/10 small amount from Soap sud enema) 60 p.o. recorded 2200 IV 750 urine BM x1  afebrile vital signs are stable No labs Intake/Output from previous day: 02/10 0701 - 02/11 0700 In: 2301.3 [P.O.:60; I.V.:2241.3] Out: 751 [Urine:750; Stool:1] Intake/Output this shift: No intake/output data recorded.  General appearance: alert, cooperative and no distress Resp: clear to auscultation bilaterally GI: Some ongoing distention.  Few bowel sounds, minimal results with soapsuds enema.  Lab Results:  Recent Labs    12/10/18 1412  WBC 9.5  HGB 12.7  HCT 37.0  PLT 320    BMET Recent Labs    12/10/18 1412  NA 140  K 3.7  CL 105  CO2 27  GLUCOSE 128*  BUN 16  CREATININE 0.68  CALCIUM 9.4   PT/INR No results for input(s): LABPROT, INR in the last 72 hours.  Recent Labs  Lab 12/10/18 1412  AST 22  ALT 14  ALKPHOS 53  BILITOT 0.8  PROT 7.1  ALBUMIN 4.3     Lipase     Component Value Date/Time   LIPASE 25 12/10/2018 1412     Medications: . enoxaparin (LOVENOX) injection  40 mg Subcutaneous Q24H   . dextrose 5 % and 0.45 % NaCl with KCl 20 mEq/L 100 mL/hr at 12/12/18 0600    Assessment/Plan Chronic constipation -patient averages 1 bowel movement per week last BM was Friday, 12/08/2018   Small bowel obstruction Hx gastric sleeve resection 2017, Hx exploratory laparotomy with lysis of adhesions, Hx incisional hernia repair  -Less distended and  tender.  No flatus/BM recorded.  FEN: The fluids/n.p.o. ID: None DVT: Lovenox Follow-up: To be determined   Plan: Small bowel protocol orally.  No NG.  We will also try the soapsuds enema again today.      LOS: 0 days    Maven Rosander 12/12/2018 [phone]

## 2018-12-13 MED ORDER — FLEET ENEMA 7-19 GM/118ML RE ENEM: 1.0000 | ENEMA | Freq: Every day | RECTAL | 0 refills | Status: AC | PRN

## 2018-12-13 MED ORDER — MAGNESIUM HYDROXIDE 400 MG/5ML PO SUSP: 30.0000 mL | Freq: Every day | ORAL | Status: AC | PRN

## 2018-12-13 MED ORDER — DOCUSATE SODIUM 100 MG PO CAPS: 100.0000 mg | ORAL_CAPSULE | Freq: Two times a day (BID) | ORAL | Status: AC

## 2018-12-13 MED ORDER — ACETAMINOPHEN 325 MG PO TABS: 650.0000 mg | ORAL_TABLET | Freq: Four times a day (QID) | ORAL | Status: AC | PRN

## 2018-12-13 NOTE — Discharge Instructions (Signed)
Constipation, Adult Constipation is when a person has fewer bowel movements in a week than normal, has difficulty having a bowel movement, or has stools that are dry, hard, or larger than normal. Constipation may be caused by an underlying condition. It may become worse with age if a person takes certain medicines and does not take in enough fluids. Follow these instructions at home: Eating and drinking   Eat foods that have a lot of fiber, such as fresh fruits and vegetables, whole grains, and beans.  Limit foods that are high in fat, low in fiber, or overly processed, such as french fries, hamburgers, cookies, candies, and soda.  Drink enough fluid to keep your urine clear or pale yellow. General instructions  Exercise regularly or as told by your health care provider.  Go to the restroom when you have the urge to go. Do not hold it in.  Take over-the-counter and prescription medicines only as told by your health care provider. These include any fiber supplements.  Practice pelvic floor retraining exercises, such as deep breathing while relaxing the lower abdomen and pelvic floor relaxation during bowel movements.  Watch your condition for any changes.  Keep all follow-up visits as told by your health care provider. This is important. Contact a health care provider if:  You have pain that gets worse.  You have a fever.  You do not have a bowel movement after 4 days.  You vomit.  You are not hungry.  You lose weight.  You are bleeding from the anus.  You have thin, pencil-like stools. Get help right away if:  You have a fever and your symptoms suddenly get worse.  You leak stool or have blood in your stool.  Your abdomen is bloated.  You have severe pain in your abdomen.  You feel dizzy or you faint. This information is not intended to replace advice given to you by your health care provider. Make sure you discuss any questions you have with your health care  provider. Document Released: 07/16/2004 Document Revised: 05/07/2016 Document Reviewed: 04/07/2016 Elsevier Interactive Patient Education  2019 [employer]   Bowel Obstruction A bowel obstruction means that something is blocking the small or large bowel. The bowel is also called the intestine. It is the long tube that connects the stomach to the opening of the butt (anus). When something blocks the bowel, food and fluids cannot pass through like normal. This condition needs to be treated. Treatment depends on the cause of the problem and how bad the problem is. What are the causes? Common causes of this condition include:  Scar tissue (adhesions) from past surgery or from high-energy X-rays (radiation).  Recent surgery in the belly. This affects how food moves in the bowel.  Some diseases, such as: ? Irritation of the lining of the digestive tract (Crohn's disease). ? Irritation of small pouches in the bowel (diverticulitis).  Growths or tumors.  A bulging organ (hernia).  Twisting of the bowel (volvulus).  A foreign body.  Slipping of a part of the bowel into another part (intussusception). What are the signs or symptoms? Symptoms of this condition include:  Pain in the belly.  Feeling sick to your stomach (nauseous).  Throwing up (vomiting).  Bloating in the belly.  Being unable to pass gas.  Trouble pooping (constipation).  Watery poop (diarrhea).  A lot of belching. How is this diagnosed? This condition may be diagnosed based on:  A physical exam.  Medical history.  Imaging tests,  such as X-ray or CT scan.  Blood tests.  Urine tests. How is this treated? Treatment for this condition may include:  Fluids and pain medicines that are given through an IV tube. Your doctor may tell you not to eat or drink if you feel sick to your stomach and are throwing up.  Eating a clear liquid diet for a few days.  Putting a small tube (nasogastric tube) into the  stomach. This will help with pain, discomfort, and nausea by removing blocked air and fluids from the stomach.  Surgery. This may be needed if other treatments do not work. Follow these instructions at home: Medicines  Take over-the-counter and prescription medicines only as told by your doctor.  If you were prescribed an antibiotic medicine, take it as told by your doctor. Do not stop taking the antibiotic even if you start to feel better. General instructions  Follow your diet as told by your doctor. You may need to: ? Only drink clear liquids until you start to get better. ? Avoid solid foods.  Return to your normal activities as told by your doctor. Ask your doctor what activities are safe for you.  Do not sit for a long time without moving. Get up to take short walks every 1-2 hours. This is important. Ask for help if you feel weak or unsteady.  Keep all follow-up visits as told by your doctor. This is important. How is this prevented? After having a bowel obstruction, you may be more likely to have another. You can do some things to stop it from happening again.  If you have a long-term (chronic) disease, contact your doctor if you see changes or problems.  Take steps to prevent or treat trouble pooping. Your doctor may ask that you: ? Drink enough fluid to keep your pee (urine) pale yellow. ? Take over-the-counter or prescription medicines. ? Eat foods that are high in fiber. These include beans, whole grains, and fresh fruits and vegetables. ? Limit foods that are high in fat and sugar. These include fried or sweet foods.  Stay active. Ask your doctor which exercises are safe for you.  Avoid stress.  Eat three small meals and three small snacks each day.  Work with a Psychologist, prison and probation services (dietitian) to make a meal plan that works for you.  Do not use any products that contain nicotine or tobacco, such as cigarettes and e-cigarettes. If you need help quitting, ask your  doctor. Contact a doctor if:  You have a fever.  You have chills. Get help right away if:  You have pain or cramps that get worse.  You throw up blood.  You are sick to your stomach.  You cannot stop throwing up.  You cannot drink fluids.  You feel mixed up (confused).  You feel very thirsty (dehydrated).  Your belly gets more bloated.  You feel weak or you pass out (faint). Summary  A bowel obstruction means that something is blocking the small or large bowel.  Treatment may include IV fluids and pain medicine. You may also have a clear liquid diet, a small tube in your stomach, or surgery.  Drink clear liquids and avoid solid foods until you get better. This information is not intended to replace advice given to you by your health care provider. Make sure you discuss any questions you have with your health care provider. Document Released: 11/25/2004 Document Revised: 03/01/2018 Document Reviewed: 03/01/2018 Elsevier Interactive Patient Education  2019 [employer]. GETTING TO  GOOD BOWEL HEALTH. Irregular bowel habits such as constipation and diarrhea can lead to many problems over time.  Having one soft bowel movement a day is the most important way to prevent further problems.  The anorectal canal is designed to handle stretching and feces to safely manage our ability to get rid of solid waste (feces, poop, stool) out of our body.  BUT, hard constipated stools can act like ripping concrete bricks and diarrhea can be a burning fire to this very sensitive area of our body, causing inflamed hemorrhoids, anal fissures, increasing risk is perirectal abscesses, abdominal pain/bloating, an making irritable bowel worse.     The goal: ONE SOFT BOWEL MOVEMENT A DAY!  To have soft, regular bowel movements:   Drink at least 8 tall glasses of water a day.    Take plenty of fiber.  Fiber is the undigested part of plant food that passes into the colon, acting s natures broom to  encourage bowel motility and movement.  Fiber can absorb and hold large amounts of water. This results in a larger, bulkier stool, which is soft and easier to pass. Work gradually over several weeks up to 6 servings a day of fiber (25g a day even more if needed) in the form of: o Vegetables -- Root (potatoes, carrots, turnips), leafy green (lettuce, salad greens, celery, spinach), or cooked high residue (cabbage, broccoli, etc) o Fruit -- Fresh (unpeeled skin & pulp), Dried (prunes, apricots, cherries, etc ),  or stewed ( applesauce)  o Whole grain breads, pasta, etc (whole wheat)  o Bran cereals   Bulking Agents -- This type of water-retaining fiber generally is easily obtained each day by one of the following:  o Psyllium bran -- The psyllium plant is remarkable because its ground seeds can retain so much water. This product is available as Metamucil, Konsyl, Effersyllium, Per Diem Fiber, or the less expensive generic preparation in drug and health food stores. Although labeled a laxative, it really is not a laxative.  o Methylcellulose -- This is another fiber derived from wood which also retains water. It is available as Citrucel. o Polyethylene Glycol - and artificial fiber commonly called Miralax or Glycolax.  It is helpful for people with gassy or bloated feelings with regular fiber o Flax Seed - a less gassy fiber than psyllium  No reading or other relaxing activity while on the toilet. If bowel movements take longer than 5 minutes, you are too constipated  AVOID CONSTIPATION.  High fiber and water intake usually takes care of this.  Sometimes a laxative is needed to stimulate more frequent bowel movements, but   Laxatives are not a good long-term solution as it can wear the colon out. o Osmotics (Milk of Magnesia, Fleets phosphosoda, Magnesium citrate, MiraLax, GoLytely) are safer than  o Stimulants (Senokot, Castor Oil, Dulcolax, Ex Lax)    o Do not take laxatives for more than 7days  in a row.   IF SEVERELY CONSTIPATED, try a Bowel Retraining Program: o Do not use laxatives.  o Eat a diet high in roughage, such as bran cereals and leafy vegetables.  o Drink six (6) ounces of prune or apricot juice each morning.  o Eat two (2) large servings of stewed fruit each day.  o Take one (1) heaping tablespoon of a psyllium-based bulking agent twice a day. Use sugar-free sweetener when possible to avoid excessive calories.  o Eat a normal breakfast.  o Set aside 15 minutes after breakfast to sit  on the toilet, but do not strain to have a bowel movement.  o If you do not have a bowel movement by the third day, use an enema and repeat the above steps.   Controlling diarrhea o Switch to liquids and simpler foods for a few days to avoid stressing your intestines further. o Avoid dairy products (especially milk & ice cream) for a short time.  The intestines often can lose the ability to digest lactose when stressed. o Avoid foods that cause gassiness or bloating.  Typical foods include beans and other legumes, cabbage, broccoli, and dairy foods.  Every person has some sensitivity to other foods, so listen to our body and avoid those foods that trigger problems for you. o Adding fiber (Citrucel, Metamucil, psyllium, Miralax) gradually can help thicken stools by absorbing excess fluid and retrain the intestines to act more normally.  Slowly increase the dose over a few weeks.  Too much fiber too soon can backfire and cause cramping & bloating. o Probiotics (such as active yogurt, Align, etc) may help repopulate the intestines and colon with normal bacteria and calm down a sensitive digestive tract.  Most studies show it to be of mild help, though, and such products can be costly. o Medicines: - Bismuth subsalicylate (ex. Kayopectate, Pepto Bismol) every 30 minutes for up to 6 doses can help control diarrhea.  Avoid if pregnant. - Loperamide (Immodium) can slow down diarrhea.  Start with two  tablets (4mg  total) first and then try one tablet every 6 hours.  Avoid if you are having fevers or severe pain.  If you are not better or start feeling worse, stop all medicines and call your doctor for advice o Call your doctor if you are getting worse or not better.  Sometimes further testing (cultures, endoscopy, X-ray studies, bloodwork, etc) may be needed to help diagnose and treat the cause of the diarrhea.

## 2018-12-13 NOTE — Discharge Summary (Signed)
[facility] Discharge Summary   Patient ID: Paula Hampton MRN: [id number] DOB/AGE: Jul 14, 1956 63 y.o.  Admit date: 12/10/2018 Discharge date: 12/13/2018  Admitting Diagnosis: SBO  Discharge Diagnosis SBO Chronic constipation  Consultants None  Imaging: Dg Abd Portable 1v-small Bowel Obstruction Protocol-initial, 8 Hr Delay  Result Date: 12/12/2018 CLINICAL DATA:  Follow-up small-bowel protocol EXAM: PORTABLE ABDOMEN - 1 VIEW COMPARISON:  12/11/2018 FINDINGS: No significant small bowel dilatation is noted. Previously administered contrast material now lies throughout the colon. No other focal abnormality is seen. IMPRESSION: Administered contrast now lies within the colon. No definitive small bowel obstructive changes are seen. Electronically Signed   By: Alcide Clever M.D.   On: 12/12/2018 21:11    Procedures None  Hospital Course:  Patient is a 63 year old female who presented to [facility] with abdominal pain.  Workup showed severe constipation causing bowel obstruction.  Patient was admitted and underwent enemas and given PO gastrografin with improvement in obstructive symptoms. Diet was advanced as tolerated.  On 12/13/18, the patient was voiding well, tolerating diet, ambulating well, pain well controlled, vital signs stableand felt stable for discharge home.  Patient will follow up with PCP. Given instructions on bowel regimen and constipation for home.    Physical Exam: General:  Alert, NAD, pleasant, comfortable Abd:  Soft, ND, non tender, +BS Resp: normal effort, CTAB CV: RRR, distal pulses intact   Allergies as of 12/13/2018   No Known Allergies     Medication List    TAKE these medications   acetaminophen 325 MG tablet Commonly known as:  TYLENOL Take 2 tablets (650 mg total) by mouth every 6 (six) hours as needed for mild pain or fever (or temp > 100).   docusate sodium 100 MG capsule Commonly known as:  COLACE Take 1 capsule (100 mg total) by  mouth 2 (two) times daily.   magnesium hydroxide 400 MG/5ML suspension Commonly known as:  MILK OF MAGNESIA Take 30 mLs by mouth daily as needed for moderate constipation.   sodium phosphate 7-19 GM/118ML Enem Place 133 mLs (1 enema total) rectally daily as needed for severe constipation.        Follow-up Information    [facility] Follow up.   Specialty:  Internal Medicine Why:  Ask about treatments for constipation Contact information: [address] [place] [address] [phone]           Signed: Wells Guiles, [facility] 12/13/2018, 11:18 AM Pager: [phone]

## 2018-12-13 NOTE — Progress Notes (Signed)
IV removed. Discharge instructions given to patient and husband. No questions. NT will wheel patient out the front of the building

## 2018-12-29 DIAGNOSIS — Z8719 Personal history of other diseases of the digestive system: Secondary | ICD-10-CM | POA: Diagnosis not present

## 2018-12-29 DIAGNOSIS — K912 Postsurgical malabsorption, not elsewhere classified: Secondary | ICD-10-CM | POA: Diagnosis not present

## 2018-12-29 DIAGNOSIS — Z903 Acquired absence of stomach [part of]: Secondary | ICD-10-CM | POA: Diagnosis not present

## 2019-01-08 DIAGNOSIS — R109 Unspecified abdominal pain: Secondary | ICD-10-CM | POA: Diagnosis not present

## 2019-01-19 DIAGNOSIS — K5900 Constipation, unspecified: Secondary | ICD-10-CM | POA: Diagnosis not present

## 2019-01-19 DIAGNOSIS — R109 Unspecified abdominal pain: Secondary | ICD-10-CM | POA: Diagnosis not present
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
## 2020-03-03 IMAGING — CT CT ABD-PELV W/ CM
2 of 5 series · 16 of 46 positions shown, 18 images · IV contrast (ISOVUE)
Comparison: None

CLINICAL DATA: Lower abdominal pain since last night, sharp
nonradiating pain below umbilicus initially which has started to
moved to the RIGHT side, history of diarrhea and emesis as well.
Past history diabetes mellitus, hypertension

EXAM:
CT ABDOMEN AND PELVIS WITH CONTRAST
TECHNIQUE: Multidetector CT imaging of the abdomen and pelvis was performed
using the standard protocol following bolus administration of
intravenous contrast. Sagittal and coronal MPR images reconstructed
from axial data set.
CONTRAST:  100mL [id number] IOPAMIDOL ([id number]) INJECTION 61% IV.
No oral contrast.

[Series 2: axial st · axial · 0.70mm/px · z∈[-408,-18]mm · 13 of 90 slices shown, 15 images]
[im 6/90  soft-tissue]
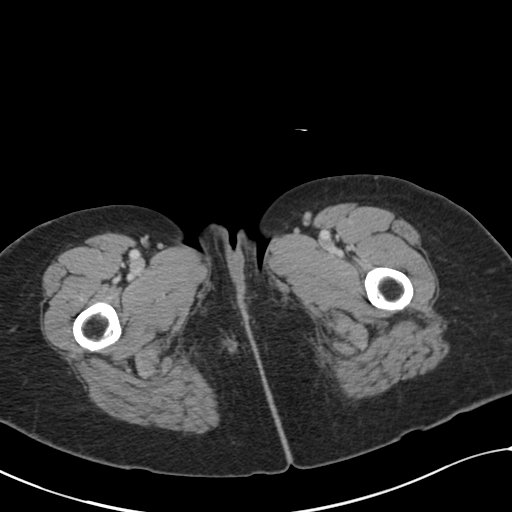
[im 6/90  bone]
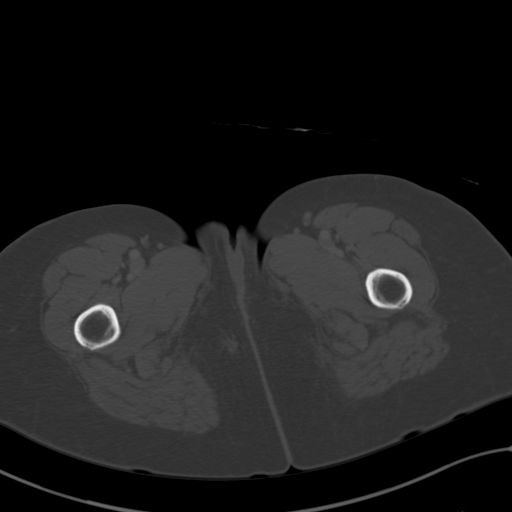
[im 12/90  soft-tissue]
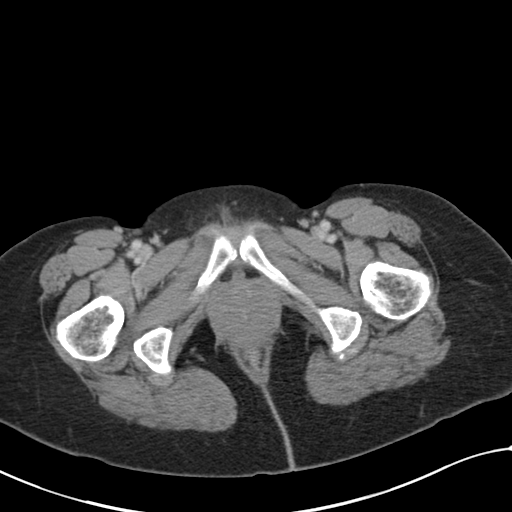
[im 18/90  soft-tissue]
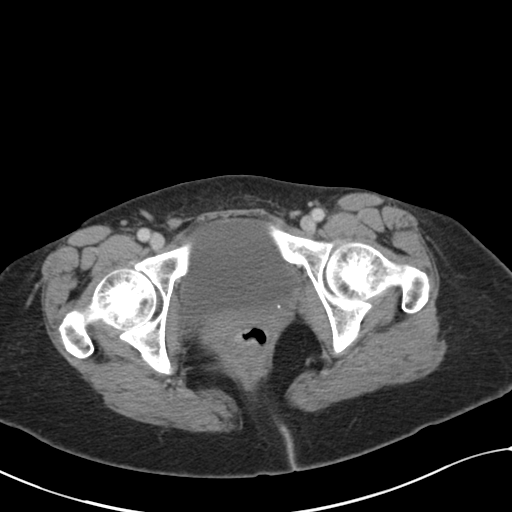
[im 24/90  soft-tissue]
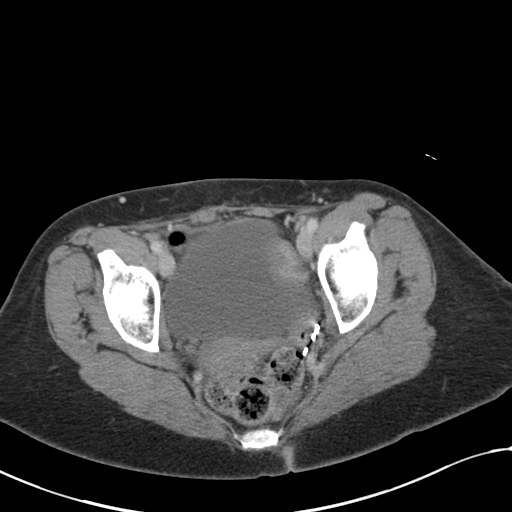
[im 30/90  soft-tissue]
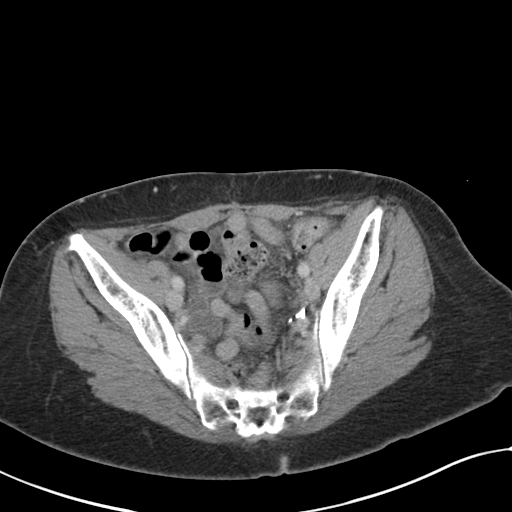
[im 36/90  soft-tissue]
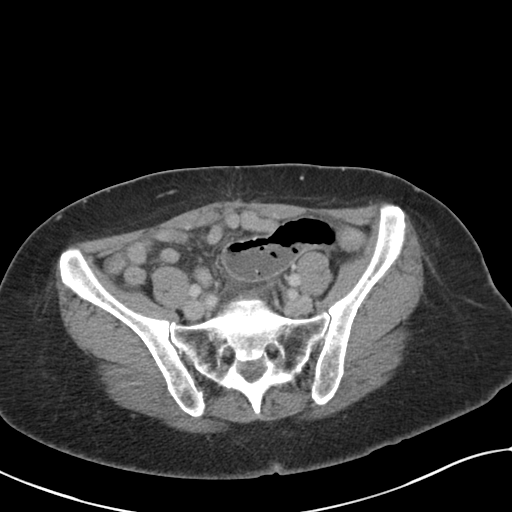
[im 48/90  soft-tissue]
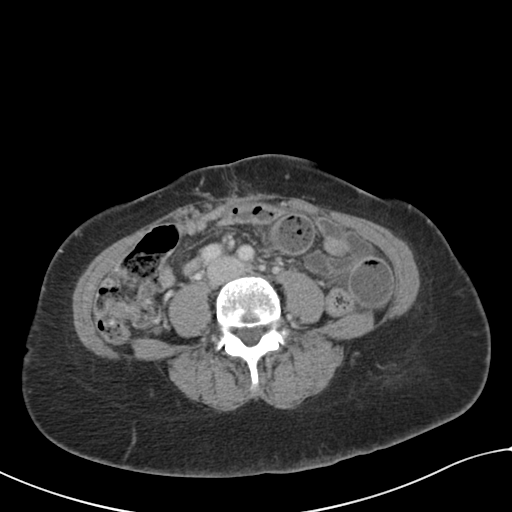
[im 54/90  soft-tissue]
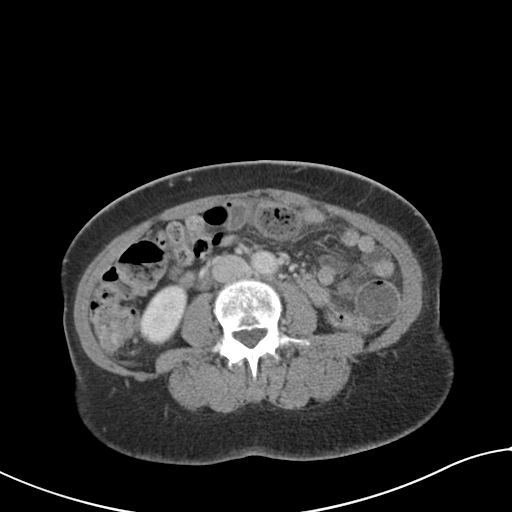
[im 60/90  soft-tissue]
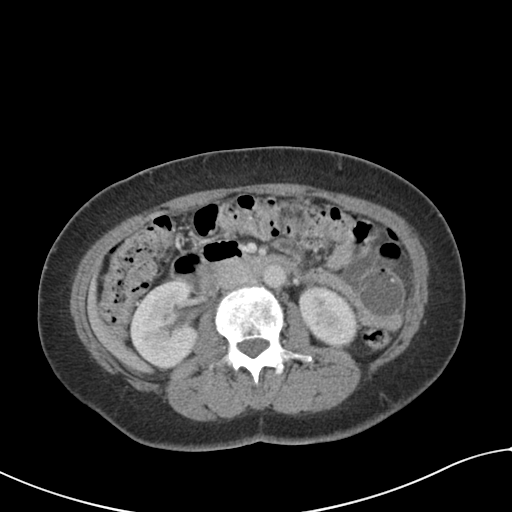
[im 60/90  bone]
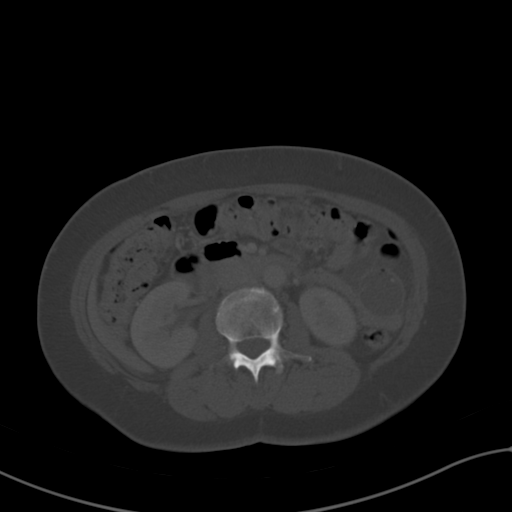
[im 66/90  soft-tissue]
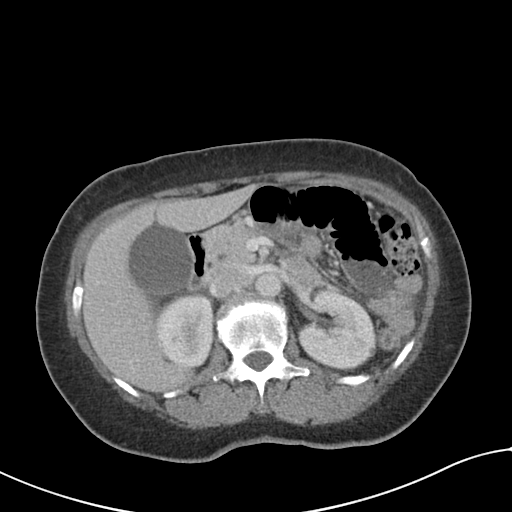
[im 72/90  soft-tissue]
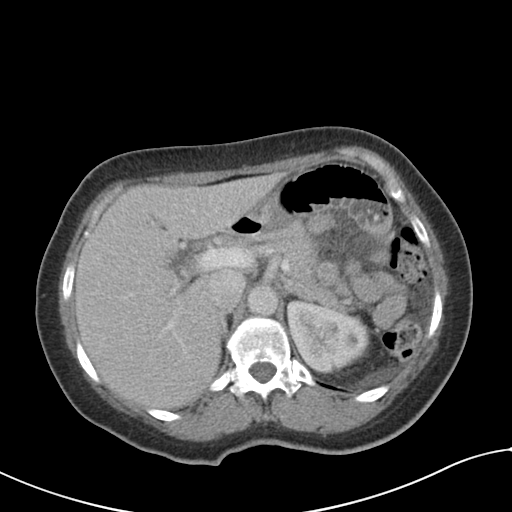
[im 78/90  soft-tissue]
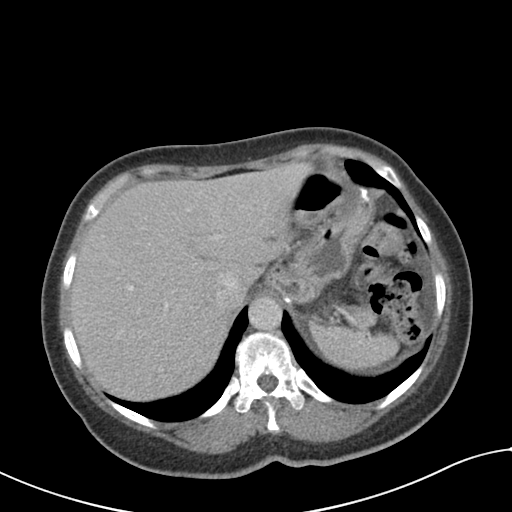
[im 84/90  soft-tissue]
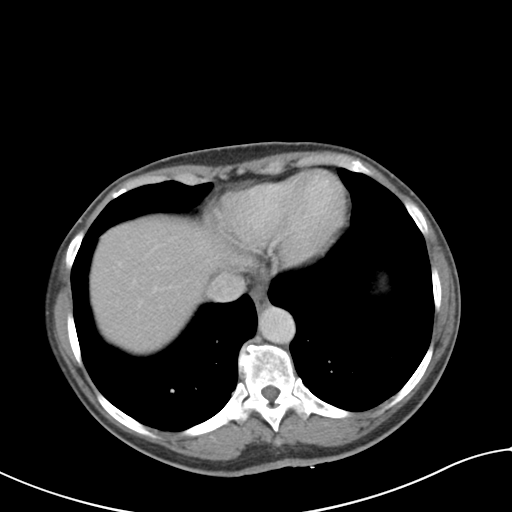

[Series 5: coronal st · coronal · 0.73mm/px · 3 of 84 slices shown]
[im 28/84  soft-tissue]
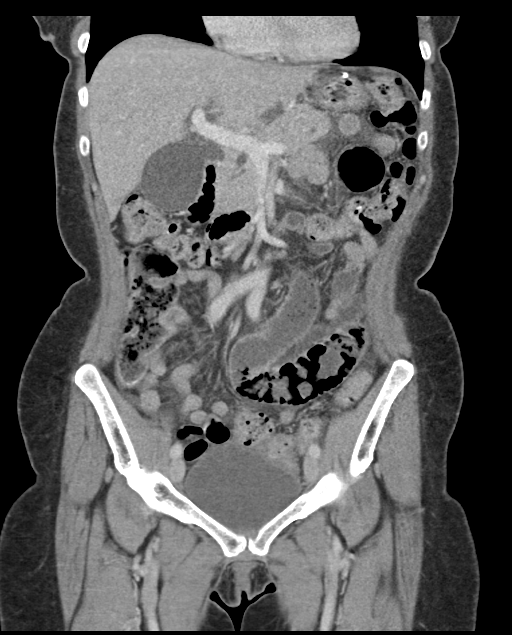
[im 37/84  soft-tissue]
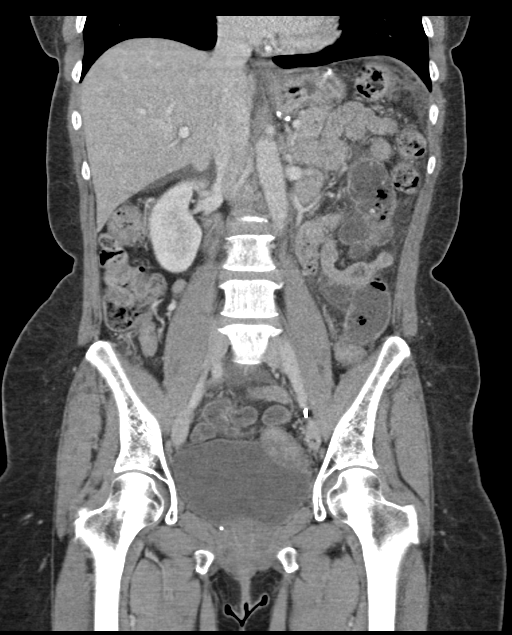
[im 47/84  soft-tissue]
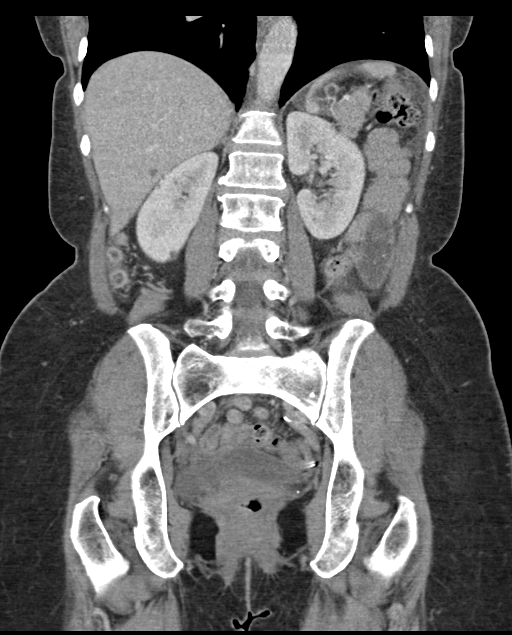

[16 of 46 positions shown; findings below may reference images not displayed]

FINDINGS: Lower chest: Lung bases clear

Hepatobiliary: Distended gallbladder. Small hepatic cyst medial
RIGHT lobe superiorly 8 x 7 mm. Liver otherwise unremarkable.

Pancreas: Normal appearance

Spleen: Normal appearance

Adrenals/Urinary Tract: Small LEFT renal cysts. Adrenal glands,
kidneys, ureters, and bladder otherwise unremarkable.

Stomach/Bowel: Appendix not visualized but no pericecal inflammatory
process seen. Prior gastric bypass surgery with gastrojejunostomy.
Few dilated proximal small bowel loops with stool sign and
decompressed mid to distal small bowel loops consistent with
proximal small bowel obstruction. Transition appears to be in the
mid abdomen just LEFT of midline favor adhesion. Colon decompressed.

Vascular/Lymphatic: Uterus atrophic with nonvisualization of ovaries

Reproductive: No free air or free fluid.

Other: No free air or free fluid.  No hernia.

Musculoskeletal: Unremarkable
IMPRESSION: Proximal small bowel obstruction with transition zone in the
anterior mid abdomen favor adhesion.

Prior gastric bypass surgery.

## 2020-03-05 IMAGING — DX DG ABD PORTABLE 1V
1 series · 1 of 1 positions shown · non-contrast
Comparison: 12/11/2018

CLINICAL DATA: Follow-up small-bowel protocol

EXAM:
PORTABLE ABDOMEN - 1 VIEW

[abdomen kub]
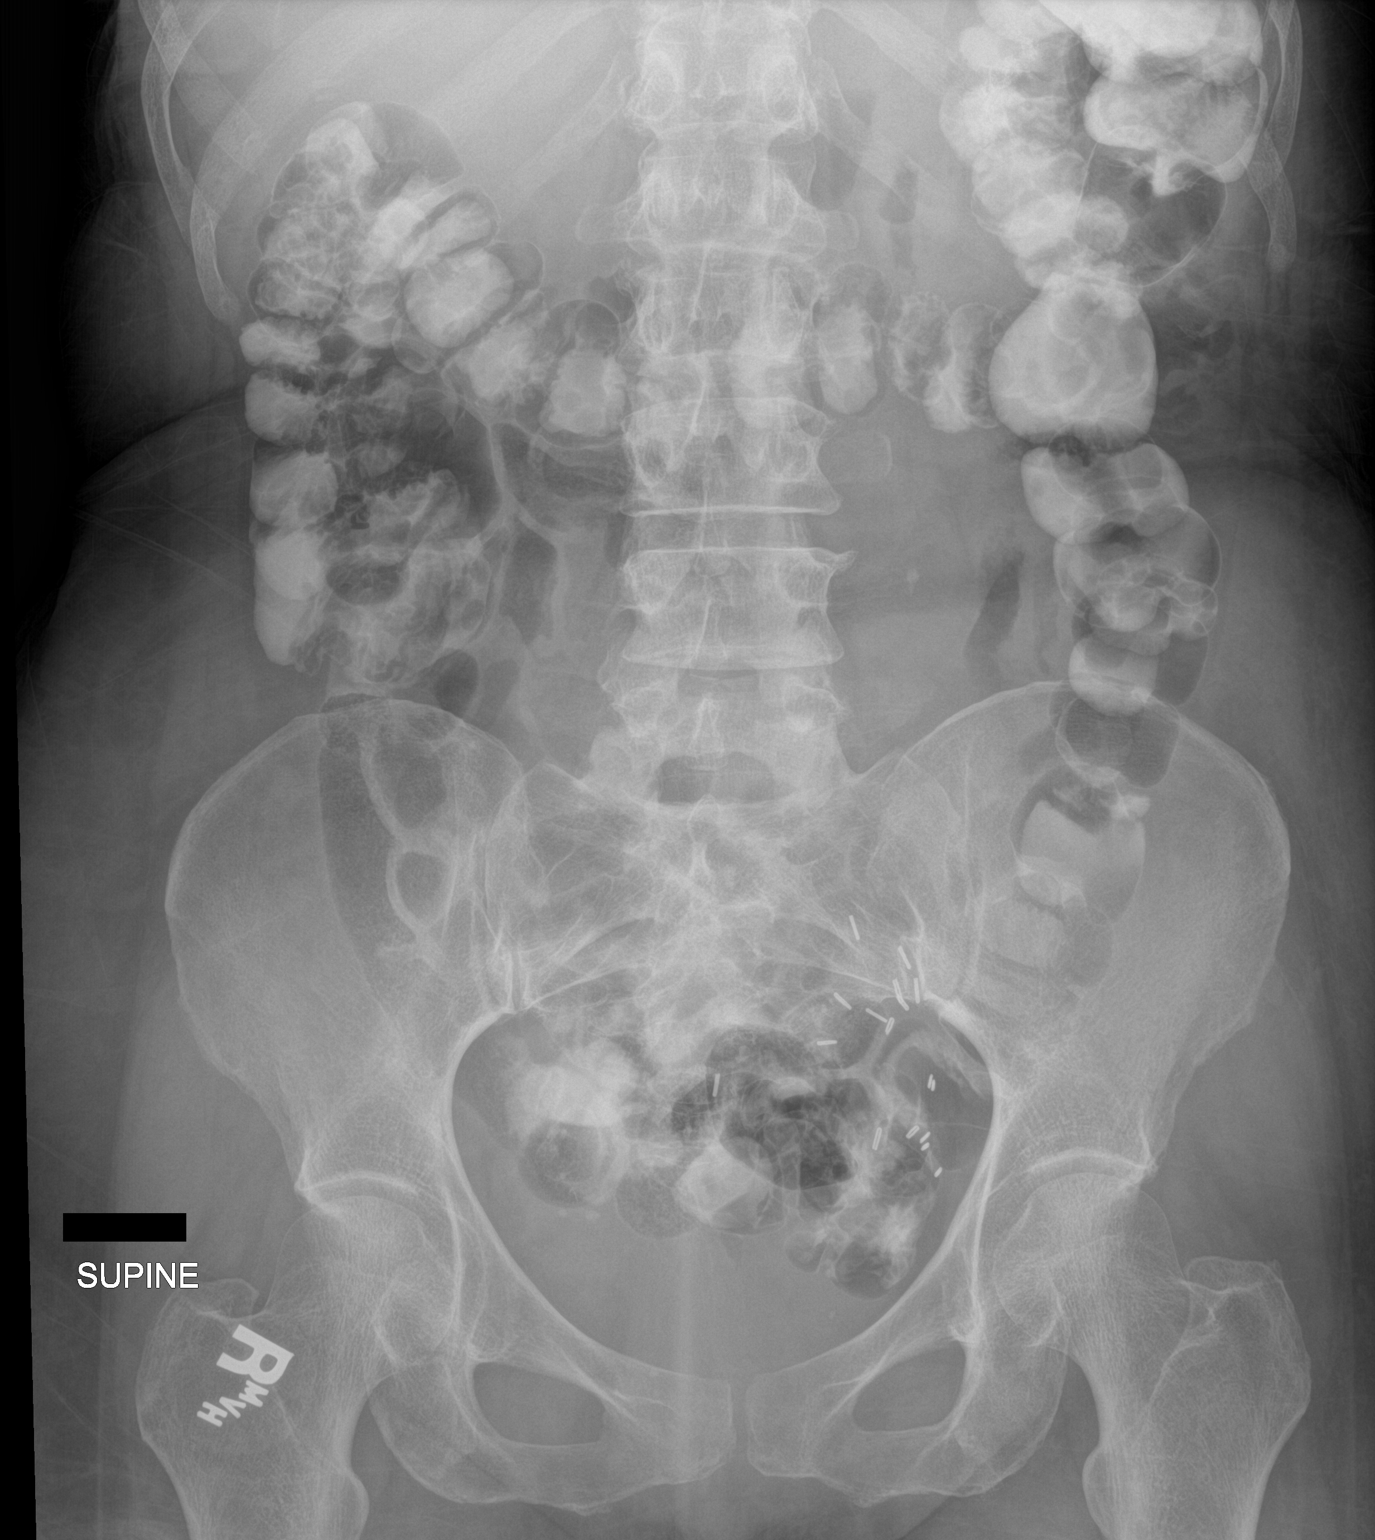

[1 of 1 positions shown; findings below may reference images not displayed]

FINDINGS: No significant small bowel dilatation is noted. Previously
administered contrast material now lies throughout the colon. No
other focal abnormality is seen.
IMPRESSION: Administered contrast now lies within the colon. No definitive small
bowel obstructive changes are seen.

## 2021-05-07 ENCOUNTER — Ambulatory Visit: Payer: Self-pay | Admitting: Podiatry

## 2021-09-03 ENCOUNTER — Other Ambulatory Visit: Payer: Self-pay | Admitting: Internal Medicine

## 2021-09-03 DIAGNOSIS — Z1231 Encounter for screening mammogram for malignant neoplasm of breast: Secondary | ICD-10-CM

## 2021-10-09 ENCOUNTER — Ambulatory Visit: Payer: BLUE CROSS/BLUE SHIELD

## 2022-08-16 ENCOUNTER — Ambulatory Visit (INDEPENDENT_AMBULATORY_CARE_PROVIDER_SITE_OTHER): Payer: Medicare Other

## 2022-08-16 ENCOUNTER — Ambulatory Visit: Payer: Medicare Other | Admitting: Podiatry

## 2022-08-16 DIAGNOSIS — M7751 Other enthesopathy of right foot: Secondary | ICD-10-CM

## 2022-08-16 DIAGNOSIS — M79671 Pain in right foot: Secondary | ICD-10-CM

## 2022-08-16 DIAGNOSIS — M79672 Pain in left foot: Secondary | ICD-10-CM | POA: Diagnosis not present

## 2022-08-16 DIAGNOSIS — M722 Plantar fascial fibromatosis: Secondary | ICD-10-CM

## 2022-08-16 DIAGNOSIS — M7752 Other enthesopathy of left foot: Secondary | ICD-10-CM | POA: Diagnosis not present

## 2022-08-16 DIAGNOSIS — M779 Enthesopathy, unspecified: Secondary | ICD-10-CM

## 2022-08-16 MED ORDER — TRIAMCINOLONE ACETONIDE 10 MG/ML IJ SUSP
20.0000 mg | Freq: Once | INTRAMUSCULAR | Status: AC
  Administered 2022-08-16: 20 mg

## 2022-08-16 MED ORDER — DICLOFENAC SODIUM 75 MG PO TBEC: 75.0000 mg | DELAYED_RELEASE_TABLET | Freq: Two times a day (BID) | ORAL | 2 refills | Status: DC

## 2022-08-17 ENCOUNTER — Other Ambulatory Visit: Payer: Self-pay | Admitting: Podiatry

## 2022-08-17 DIAGNOSIS — M779 Enthesopathy, unspecified: Secondary | ICD-10-CM

## 2022-08-18 ENCOUNTER — Other Ambulatory Visit: Payer: Self-pay | Admitting: Podiatry

## 2022-08-18 MED ORDER — GABAPENTIN 300 MG PO CAPS: 300.0000 mg | ORAL_CAPSULE | Freq: Three times a day (TID) | ORAL | 3 refills | Status: DC

## 2022-08-18 NOTE — Progress Notes (Signed)
Subjective:   Patient ID: Paula Hampton, female   DOB: 66 y.o.   MRN: [id number]   HPI Patient presents stating she is having a lot of foot and ankle pain and swelling and also feels sharp nervelike discomfort with burning.  Patient's pain is located in the arch and mostly in the ankles and also into the forefoot with a distributed pattern.  Patient does not smoke tries to be active   Review of Systems  All other systems reviewed and are negative.       Objective:  Physical Exam Vitals and nursing note reviewed.  Constitutional:      Appearance: She is well-developed.  Pulmonary:     Effort: Pulmonary effort is normal.  Musculoskeletal:        General: Normal range of motion.  Skin:    General: Skin is warm.  Neurological:     Mental Status: She is alert.     Neurovascular status intact muscle strength adequate range of motion was found to be mildly reduced subtalar midtarsal joint.  Patient is noted to have inflammation pain in the sinus tarsi bilateral with inflammatory changes in the joints and has what appears to be some moderate neuropathic leg pain with inflammatory pain also.  Patient has good digital perfusion well-oriented     Assessment:  Generalized discomfort with acute discomfort within the sinus tarsi bilateral and moderate flatfoot deformity     Plan:  H&P reviewed conditions and discussed.  At this point I did do sterile prep I injected the sinus tarsi bilateral 3 mg Kenalog 5 mg Xylocaine applied sterile dressings I instructed on reduced activity and soaks and we could consider medicine at 1 point for neuropathic pain if necessary but today we placed her on an anti-inflammatory.  Reappoint to recheck  X-rays indicate that there is mild arthritis no indications of stress fracture mild depression of the arch no other pathology noted

## 2022-08-30 ENCOUNTER — Ambulatory Visit: Payer: Medicare Other | Admitting: Podiatry

## 2022-08-30 ENCOUNTER — Encounter: Payer: Self-pay | Admitting: Podiatry

## 2022-08-30 DIAGNOSIS — M7752 Other enthesopathy of left foot: Secondary | ICD-10-CM | POA: Diagnosis not present

## 2022-08-30 DIAGNOSIS — M7751 Other enthesopathy of right foot: Secondary | ICD-10-CM

## 2022-08-31 NOTE — Progress Notes (Signed)
Subjective:   Patient ID: Paula Hampton, female   DOB: 66 y.o.   MRN: [id number]   HPI Patient states significant improvement states that she still has pain if she does a lot but overall she is very happy   ROS      Objective:  Physical Exam  There are status intact with patient found to have discomfort that is improved with mild discomfort still noted     Assessment:  Overall doing well with foot pain bilateral     Plan:  H&P reviewed condition recommended anti-inflammatories physical therapy support and patient's discharge will be seen back as needed

## 2022-10-19 ENCOUNTER — Other Ambulatory Visit: Payer: Self-pay | Admitting: Podiatry

## 2022-11-08 ENCOUNTER — Other Ambulatory Visit: Payer: Self-pay | Admitting: Surgery

## 2022-11-08 DIAGNOSIS — Z1231 Encounter for screening mammogram for malignant neoplasm of breast: Secondary | ICD-10-CM

## 2022-12-26 ENCOUNTER — Other Ambulatory Visit: Payer: Self-pay | Admitting: Podiatry

## 2023-01-03 ENCOUNTER — Ambulatory Visit
Admission: RE | Admit: 2023-01-03 | Discharge: 2023-01-03 | Disposition: A | Discharge status: Home / Self Care | Payer: Medicare Other | Source: Ambulatory Visit | Attending: Surgery | Admitting: Surgery

## 2023-01-03 DIAGNOSIS — Z1231 Encounter for screening mammogram for malignant neoplasm of breast: Secondary | ICD-10-CM

## 2023-01-24 ENCOUNTER — Ambulatory Visit: Payer: Medicare Other | Admitting: Podiatry

## 2023-01-27 ENCOUNTER — Encounter: Payer: Self-pay | Admitting: Podiatry

## 2023-01-27 ENCOUNTER — Ambulatory Visit: Payer: Medicare Other | Admitting: Podiatry

## 2023-01-27 DIAGNOSIS — M7752 Other enthesopathy of left foot: Secondary | ICD-10-CM

## 2023-01-27 DIAGNOSIS — M7751 Other enthesopathy of right foot: Secondary | ICD-10-CM

## 2023-01-27 DIAGNOSIS — L600 Ingrowing nail: Secondary | ICD-10-CM | POA: Diagnosis not present

## 2023-01-27 MED ORDER — TRIAMCINOLONE ACETONIDE 10 MG/ML IJ SUSP
20.0000 mg | Freq: Once | INTRAMUSCULAR | Status: AC
  Administered 2023-01-27: 20 mg

## 2023-01-27 NOTE — Progress Notes (Signed)
Subjective:   Patient ID: Paula Hampton, female   DOB: 67 y.o.   MRN: [id number]   HPI Patient presents with pain in the ankles of both feet and also states that she has an ingrown toenail third right.  States that the ankle to get sore again and they did well for about 5 months and states that the ingrown is really bothering her she wants to get it fixed someday   ROS      Objective:  Physical Exam  Neurovascular status intact with inflammation fluid around the subtalar joint bilateral painful when pressed and incurvated lateral border right third toe painful when palpated     Assessment:  Inflammatory capsulitis of the sinus tarsi bilateral with ingrown toenail right third toe lateral border     Plan:  H&P reviewed she wants to hold off on the ingrown today but wants to get it fixed in the next few weeks and I discussed and explained correction and evaluated the nailbed found it to be irritated and slightly red recommended short-term soaks long-term correction.  Sterile prep injected the sinus tarsi bilateral 3 mg Kenalog 5 mg Xylocaine tolerated well and applied sterile dressings

## 2023-02-03 ENCOUNTER — Other Ambulatory Visit: Payer: Self-pay

## 2023-02-03 MED ORDER — GABAPENTIN 300 MG PO CAPS: 300.0000 mg | ORAL_CAPSULE | Freq: Three times a day (TID) | ORAL | 3 refills | Status: DC

## 2023-02-14 ENCOUNTER — Encounter: Payer: Self-pay | Admitting: Surgery

## 2023-02-14 DIAGNOSIS — Z1231 Encounter for screening mammogram for malignant neoplasm of breast: Secondary | ICD-10-CM

## 2023-02-17 ENCOUNTER — Encounter: Payer: Self-pay | Admitting: Podiatry

## 2023-02-17 ENCOUNTER — Ambulatory Visit (INDEPENDENT_AMBULATORY_CARE_PROVIDER_SITE_OTHER): Payer: Medicare Other | Admitting: Podiatry

## 2023-02-17 DIAGNOSIS — L600 Ingrowing nail: Secondary | ICD-10-CM | POA: Diagnosis not present

## 2023-02-17 NOTE — Patient Instructions (Signed)

## 2023-02-18 NOTE — Progress Notes (Signed)
Subjective:   Patient ID: Paula Hampton, female   DOB: 67 y.o.   MRN: [id number]   HPI Patient presents with a painful ingrown toenail right third toe lateral border that she is tried to trim and soak without relief of symptoms and she has not noted drainage   ROS      Objective:  Physical Exam  Ingrown toenail deformity right third toe lateral border with pain     Assessment:  Incurvated lateral border third toe painful when pressed with no drainage or redness noted chronic in nature     Plan:  H&P reviewed recommended correction explained procedure risk patient wants surgery infiltrated the right third toe 60 mg like Marcaine mixture sterile prep done using sterile instrumentation remove the border exposed matrix applied phenol 3 applications 30 seconds followed by alcohol lavage sterile dressing gave instructions on soaks wear dressing 24 hours take it off earlier if throbbing were to occur

## 2023-03-03 ENCOUNTER — Ambulatory Visit (INDEPENDENT_AMBULATORY_CARE_PROVIDER_SITE_OTHER): Payer: Medicare Other | Admitting: Podiatry

## 2023-03-03 ENCOUNTER — Encounter: Payer: Self-pay | Admitting: Podiatry

## 2023-03-03 DIAGNOSIS — L03031 Cellulitis of right toe: Secondary | ICD-10-CM | POA: Diagnosis not present

## 2023-03-03 MED ORDER — DOXYCYCLINE HYCLATE 100 MG PO TABS: 100.0000 mg | ORAL_TABLET | Freq: Two times a day (BID) | ORAL | 0 refills | Status: AC

## 2023-03-03 NOTE — Progress Notes (Signed)
Subjective:   Patient ID: Paula Hampton, female   DOB: 68 y.o.   MRN: [id number]   HPI Patient presents stating the third nail right is improved but still has some bleeding on the side   ROS      Objective:  Physical Exam  Neurovascular status intact with significant crusted tissue lateral side digit 3 right with localized redness no proximal edema erythema drainage noted     Assessment:  Low-grade paronychia infection right third toe     Plan:  H&P reviewed and recommended the continuation of conservative care and I went ahead today and I did place on doxycycline for 10 days as a precautionary measure.  Reappoint if redness persists or any other issues were to occur

## 2023-04-07 ENCOUNTER — Encounter: Payer: Self-pay | Admitting: Podiatry

## 2023-04-07 ENCOUNTER — Ambulatory Visit (INDEPENDENT_AMBULATORY_CARE_PROVIDER_SITE_OTHER): Payer: Medicare Other | Admitting: Podiatry

## 2023-04-07 DIAGNOSIS — M722 Plantar fascial fibromatosis: Secondary | ICD-10-CM | POA: Diagnosis not present

## 2023-04-07 MED ORDER — TRIAMCINOLONE ACETONIDE 10 MG/ML IJ SUSP
20.0000 mg | Freq: Once | INTRAMUSCULAR | Status: AC
  Administered 2023-04-07: 20 mg

## 2023-04-08 NOTE — Progress Notes (Signed)
Subjective:   Patient ID: Paula Hampton, female   DOB: 67 y.o.   MRN: [id number]   HPI Patient states she has had a reoccurrence of heel pain in both heels and was doing pretty well and it has started recently   ROS      Objective:  Physical Exam  Neurovascular status intact exquisite discomfort noted into the medial fascial bands of each heel at the insertion of the tendon calcaneus     Assessment:  Acute Planter fasciitis bilateral     Plan:  Reviewed condition continue conservative treatment sterile prep injected the plantar fascial insertion bilateral 3 mg Kenalog 5 mg Xylocaine applied sterile dressing reappoint to recheck as needed

## 2023-04-27 ENCOUNTER — Telehealth: Payer: Self-pay | Admitting: *Deleted

## 2023-04-27 NOTE — Telephone Encounter (Signed)
Patient is requesting a letter to be relieved from jury duty, unable to walk up those stairs,please advise.

## 2023-04-28 NOTE — Telephone Encounter (Signed)
She needs note for jury duty due to foot pain

## 2023-04-29 ENCOUNTER — Encounter: Payer: Self-pay | Admitting: Podiatry

## 2023-04-29 ENCOUNTER — Ambulatory Visit (INDEPENDENT_AMBULATORY_CARE_PROVIDER_SITE_OTHER): Payer: Medicare Other | Admitting: Podiatry

## 2023-04-29 DIAGNOSIS — M722 Plantar fascial fibromatosis: Secondary | ICD-10-CM | POA: Diagnosis not present

## 2023-04-29 MED ORDER — TRIAMCINOLONE ACETONIDE 10 MG/ML IJ SUSP
20.0000 mg | Freq: Once | INTRAMUSCULAR | Status: AC
  Administered 2023-04-29: 20 mg

## 2023-04-29 MED ORDER — DICLOFENAC SODIUM 75 MG PO TBEC: 75.0000 mg | DELAYED_RELEASE_TABLET | Freq: Two times a day (BID) | ORAL | 2 refills | Status: AC

## 2023-04-29 NOTE — Progress Notes (Signed)
Subjective:   Patient ID: Paula Hampton, female   DOB: 67 y.o.   MRN: [id number]   HPI Patient presents stating my heels have started to really hurt me recently and I need something to stretch my feet better   ROS      Objective:  Physical Exam  Neuro vascular status intact with patient found to have exquisite discomfort within the plantar fascia bilateral and in need of some kind of device to give her better support for her arch.  Patient is found to have good range of motion      Assessment:  Acute fasciitis symptoms with discomfort and inability for her to stretch properly     Plan:  H&P done sterile prep reinjected the plantar fascia bilateral 3 mg Kenalog 5 mg Xylocaine and I dispensed a prefab static phase with soft interface off-the-shelf night splint to provide for stretch properly fitted to her lower leg.  Placed on diclofenac reappoint as symptoms indicate

## 2023-05-02 ENCOUNTER — Ambulatory Visit: Payer: Medicare Other | Admitting: Podiatry

## 2023-06-04 ENCOUNTER — Other Ambulatory Visit: Payer: Self-pay | Admitting: Podiatry

## 2024-03-19 ENCOUNTER — Ambulatory Visit: Admitting: Podiatry

## 2024-03-23 ENCOUNTER — Ambulatory Visit: Admitting: Podiatry

## 2024-08-02 DIAGNOSIS — R0681 Apnea, not elsewhere classified: Secondary | ICD-10-CM | POA: Diagnosis not present

## 2024-08-02 DIAGNOSIS — I10 Essential (primary) hypertension: Secondary | ICD-10-CM | POA: Diagnosis not present

## 2024-08-02 DIAGNOSIS — L299 Pruritus, unspecified: Secondary | ICD-10-CM | POA: Diagnosis not present

## 2024-08-02 DIAGNOSIS — K219 Gastro-esophageal reflux disease without esophagitis: Secondary | ICD-10-CM | POA: Diagnosis not present

## 2024-08-02 DIAGNOSIS — E559 Vitamin D deficiency, unspecified: Secondary | ICD-10-CM | POA: Diagnosis not present

## 2024-08-02 DIAGNOSIS — E7800 Pure hypercholesterolemia, unspecified: Secondary | ICD-10-CM | POA: Diagnosis not present

## 2024-08-02 DIAGNOSIS — Z23 Encounter for immunization: Secondary | ICD-10-CM | POA: Diagnosis not present

## 2024-08-02 DIAGNOSIS — Z79899 Other long term (current) drug therapy: Secondary | ICD-10-CM | POA: Diagnosis not present

## 2024-08-15 ENCOUNTER — Encounter: Payer: Self-pay | Admitting: Podiatry

## 2024-08-15 ENCOUNTER — Ambulatory Visit: Admitting: Podiatry

## 2024-08-15 DIAGNOSIS — M722 Plantar fascial fibromatosis: Secondary | ICD-10-CM | POA: Diagnosis not present

## 2024-08-15 MED ORDER — TRIAMCINOLONE ACETONIDE 10 MG/ML IJ SUSP
10.0000 mg | Freq: Once | INTRAMUSCULAR | Status: AC
  Administered 2024-08-15: 10 mg via INTRA_ARTICULAR

## 2024-08-16 NOTE — Progress Notes (Signed)
 Subjective:   Patient ID: Paula Hampton, female   DOB: 68 y.o.   MRN: [id number]   HPI Patient states she did great but the medication has worn off it lasted for over a year   ROS      Objective:  Physical Exam  Neurovascular status intact with inflammation pain of the plantar fascia bilateral that is reoccurred over the last approximate 6 weeks     Assessment:  Acute plantar fasciitis bilateral     Plan:  Reviewed condition sterile prep injected the plantar fascia at insertion bilateral 3 mg Kenalog  5 mg Xylocaine and applied sterile dressings.  Reappoint to recheck

## 2024-08-30 ENCOUNTER — Other Ambulatory Visit: Payer: Self-pay | Admitting: Lab

## 2024-08-30 ENCOUNTER — Telehealth: Payer: Self-pay | Admitting: Podiatry

## 2024-08-30 MED ORDER — GABAPENTIN 300 MG PO CAPS: 300.0000 mg | ORAL_CAPSULE | Freq: Three times a day (TID) | ORAL | 3 refills | Status: AC

## 2024-08-30 NOTE — Telephone Encounter (Signed)
 Patient called requesting a refill of gabapentin . Her preferred pharmacy is the [facility] on [address].
